# Patient Record
Sex: Female | Born: 1985 | Race: Black or African American | Hispanic: No | Marital: Married | Smoking: Former smoker
Health system: Southern US, Community
[De-identification: ages and names within clinical notes are randomized; demographics above are authoritative.]

## PROBLEM LIST (undated history)

## (undated) DIAGNOSIS — I10 Essential (primary) hypertension: Secondary | ICD-10-CM

## (undated) DIAGNOSIS — E7800 Pure hypercholesterolemia, unspecified: Secondary | ICD-10-CM

## (undated) DIAGNOSIS — E282 Polycystic ovarian syndrome: Secondary | ICD-10-CM

## (undated) DIAGNOSIS — M1990 Unspecified osteoarthritis, unspecified site: Secondary | ICD-10-CM

## (undated) HISTORY — PX: LEEP: SHX91

## (undated) HISTORY — DX: Polycystic ovarian syndrome: E28.2

## (undated) HISTORY — DX: Essential (primary) hypertension: I10

## (undated) HISTORY — DX: Unspecified osteoarthritis, unspecified site: M19.90

## (undated) HISTORY — DX: Pure hypercholesterolemia, unspecified: E78.00

---

## 2007-11-05 ENCOUNTER — Emergency Department (HOSPITAL_COMMUNITY): Admission: EM | Admit: 2007-11-05 | Discharge: 2007-11-05 | Payer: Self-pay | Admitting: Emergency Medicine

## 2007-12-03 ENCOUNTER — Emergency Department (HOSPITAL_COMMUNITY): Admission: EM | Admit: 2007-12-03 | Discharge: 2007-12-03 | Payer: Self-pay | Admitting: Emergency Medicine

## 2010-04-11 IMAGING — CR DG CHEST 2V
2 series · 2 of 2 positions shown · non-contrast
Comparison: None.

CLINICAL DATA: Cough.  Sore throat.  Fever.

CHEST - 2 VIEW

[view not recorded (1 of 2)]
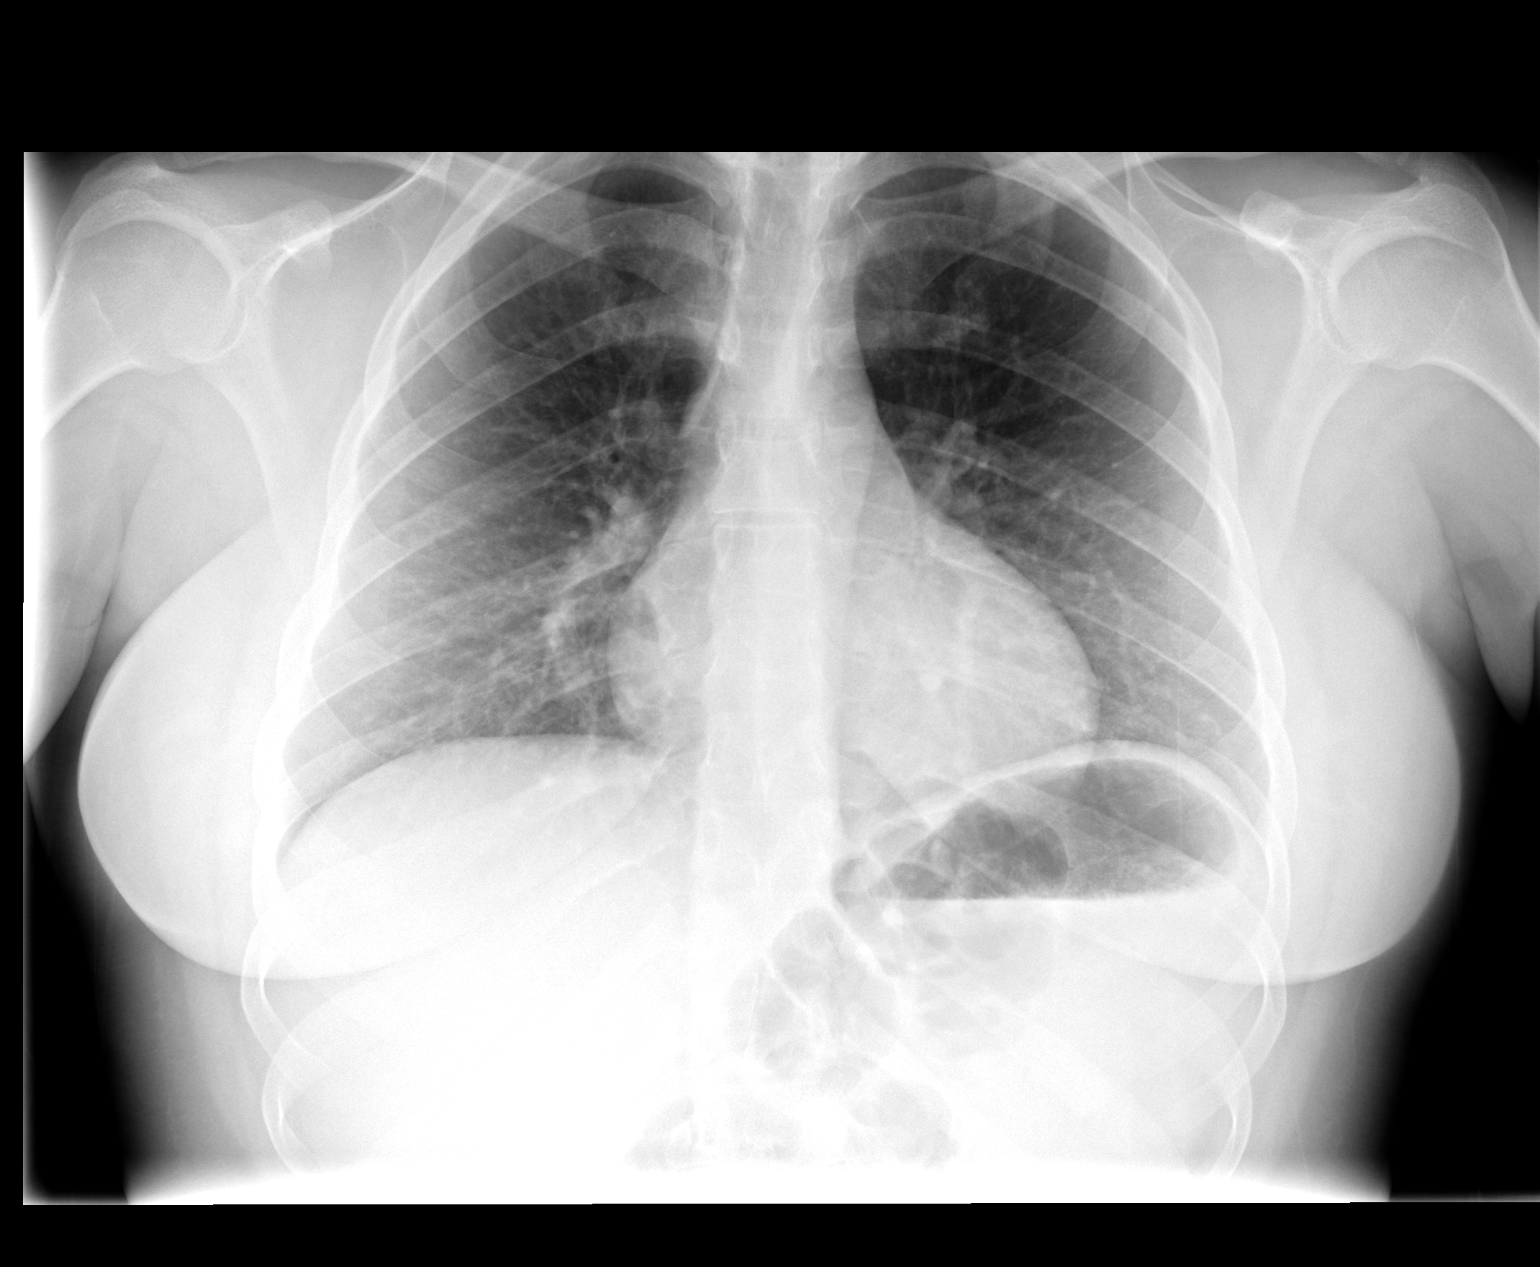

[view not recorded (2 of 2)]
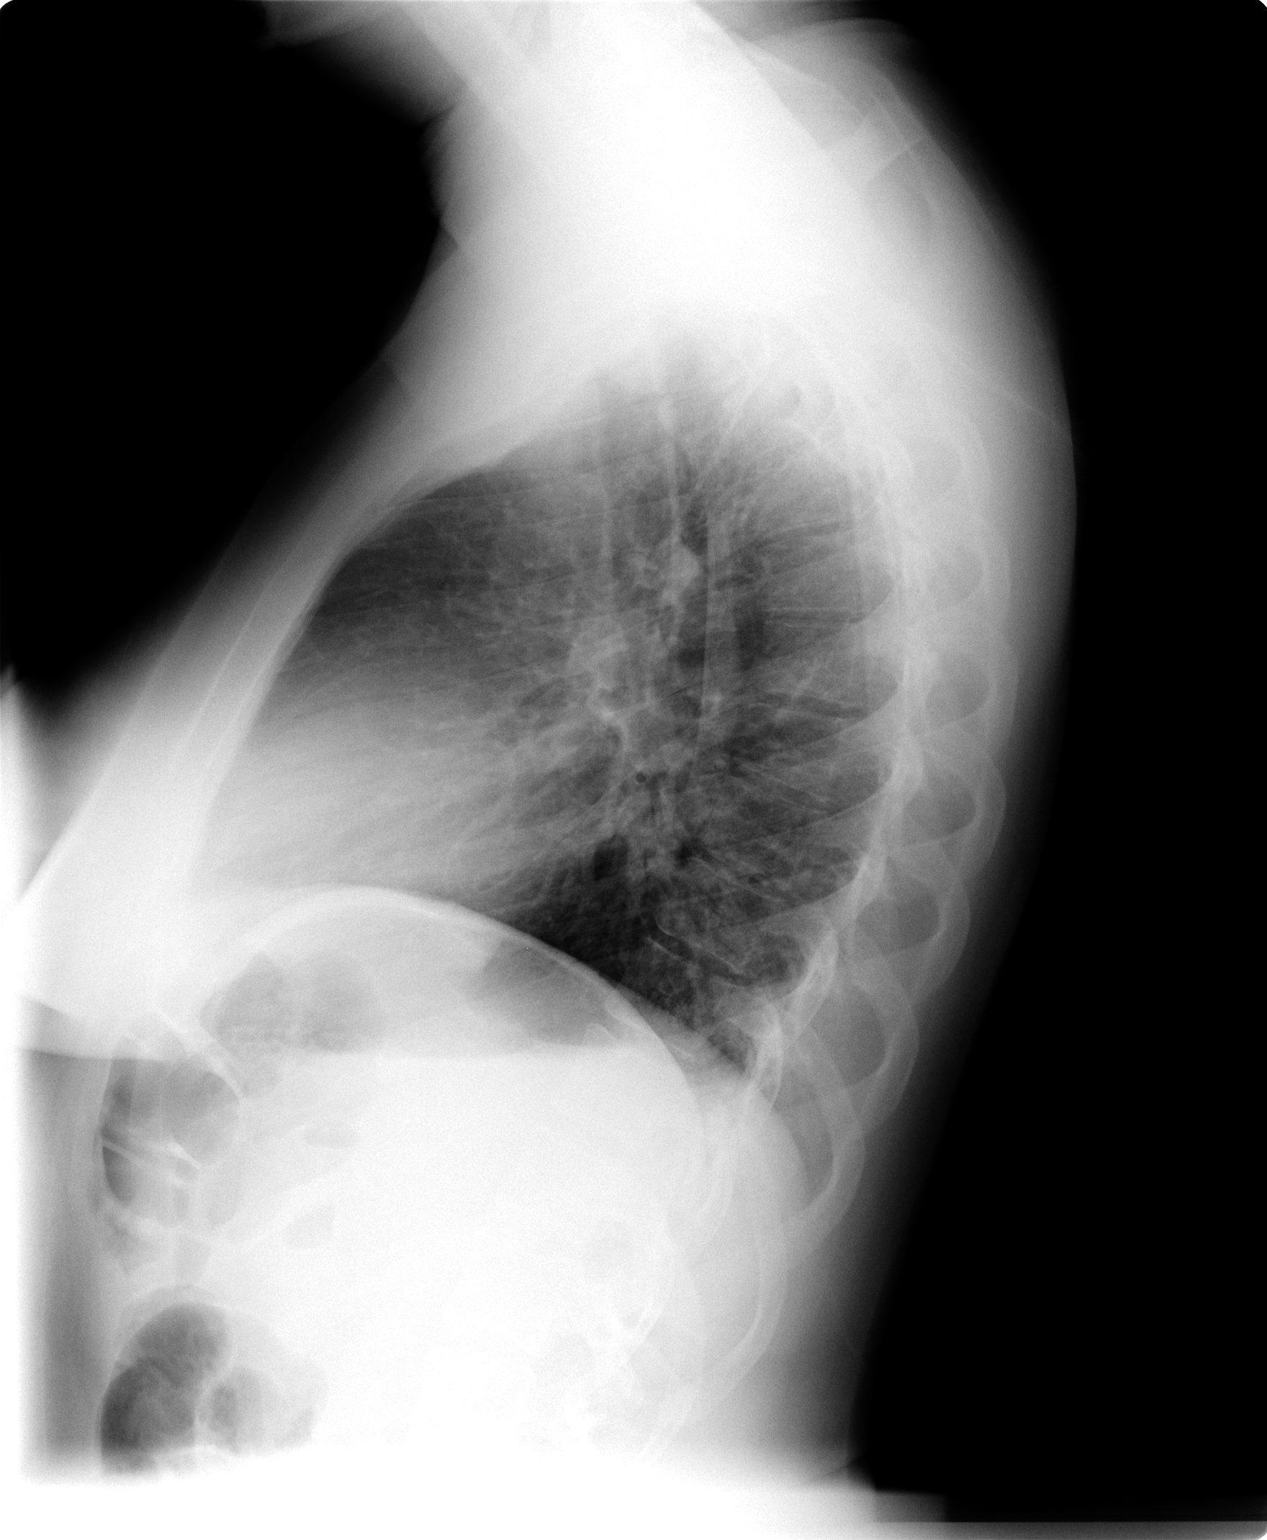

[2 of 2 positions shown; findings below may reference images not displayed]

FINDINGS: Heart size top normal with slightly globular
configuration.  No infiltrate, congestive heart failure or
pneumothorax.  Minimal peribronchial thickening.
IMPRESSION: Minimal peribronchial thickening without segmental infiltrate.

Slightly globular configuration heart.

## 2010-05-06 ENCOUNTER — Emergency Department (HOSPITAL_COMMUNITY)
Admission: EM | Admit: 2010-05-06 | Discharge: 2010-05-06 | Disposition: A | Discharge status: Home / Self Care | Payer: BC Managed Care – PPO | Attending: Emergency Medicine | Admitting: Emergency Medicine

## 2010-05-06 ENCOUNTER — Emergency Department (HOSPITAL_COMMUNITY): Payer: BC Managed Care – PPO

## 2010-05-06 DIAGNOSIS — K920 Hematemesis: Secondary | ICD-10-CM | POA: Insufficient documentation

## 2010-05-06 DIAGNOSIS — R059 Cough, unspecified: Secondary | ICD-10-CM | POA: Insufficient documentation

## 2010-05-06 DIAGNOSIS — R05 Cough: Secondary | ICD-10-CM | POA: Insufficient documentation

## 2010-05-06 LAB — URINALYSIS, ROUTINE W REFLEX MICROSCOPIC
Protein, ur: 30 mg/dL — AB
Specific Gravity, Urine: 1.036 — ABNORMAL HIGH (ref 1.005–1.030)

## 2010-05-06 LAB — GLUCOSE, CAPILLARY

## 2010-05-06 LAB — POCT I-STAT, CHEM 8
Glucose, Bld: 101 mg/dL — ABNORMAL HIGH (ref 70–99)
HCT: 41 % (ref 36.0–46.0)
TCO2: 24 mmol/L (ref 0–100)

## 2010-05-06 LAB — POCT PREGNANCY, URINE: Preg Test, Ur: NEGATIVE

## 2010-05-06 LAB — URINE MICROSCOPIC-ADD ON

## 2010-06-14 ENCOUNTER — Other Ambulatory Visit: Payer: Self-pay | Admitting: Obstetrics and Gynecology

## 2010-06-14 ENCOUNTER — Other Ambulatory Visit (HOSPITAL_COMMUNITY)
Admission: RE | Admit: 2010-06-14 | Discharge: 2010-06-14 | Disposition: A | Discharge status: Home / Self Care | Payer: Self-pay | Source: Ambulatory Visit | Attending: Obstetrics and Gynecology | Admitting: Obstetrics and Gynecology

## 2010-06-14 ENCOUNTER — Encounter (INDEPENDENT_AMBULATORY_CARE_PROVIDER_SITE_OTHER): Payer: BC Managed Care – PPO | Admitting: Obstetrics and Gynecology

## 2010-06-14 DIAGNOSIS — R87613 High grade squamous intraepithelial lesion on cytologic smear of cervix (HGSIL): Secondary | ICD-10-CM

## 2010-06-14 DIAGNOSIS — R87619 Unspecified abnormal cytological findings in specimens from cervix uteri: Secondary | ICD-10-CM | POA: Insufficient documentation

## 2010-07-07 ENCOUNTER — Ambulatory Visit (INDEPENDENT_AMBULATORY_CARE_PROVIDER_SITE_OTHER): Payer: BC Managed Care – PPO | Admitting: Obstetrics and Gynecology

## 2010-07-07 DIAGNOSIS — R87613 High grade squamous intraepithelial lesion on cytologic smear of cervix (HGSIL): Secondary | ICD-10-CM

## 2010-07-07 NOTE — Group Therapy Note (Signed)
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  The patient presents today to review these results and to discuss the next plan management.  The patient was counseled regarding the need for an excisional biopsy.  Options of in- office LEEP procedure versus cold knife cone in the operating room were discussed.  The patient is not interested in having a cold knife cone and desires an in-office procedure.  Risks, benefits, and alternatives were explained.  The patient verbalized understanding.  The patient will review the LEEP video and will be scheduled for the next available appointment for LEEP.          ______________________________ Catalina Antigua, MD    PC/MEDQ  D:  07/07/2010  T:  07/07/2010  Job:  [id number]

## 2010-08-18 ENCOUNTER — Other Ambulatory Visit: Payer: Self-pay | Admitting: Obstetrics & Gynecology

## 2010-08-18 ENCOUNTER — Encounter (INDEPENDENT_AMBULATORY_CARE_PROVIDER_SITE_OTHER): Payer: BC Managed Care – PPO | Admitting: Obstetrics & Gynecology

## 2010-08-18 ENCOUNTER — Other Ambulatory Visit (HOSPITAL_COMMUNITY)
Admission: RE | Admit: 2010-08-18 | Discharge: 2010-08-18 | Disposition: A | Discharge status: Home / Self Care | Payer: BC Managed Care – PPO | Source: Ambulatory Visit | Attending: Obstetrics & Gynecology | Admitting: Obstetrics & Gynecology

## 2010-08-18 DIAGNOSIS — D069 Carcinoma in situ of cervix, unspecified: Secondary | ICD-10-CM | POA: Insufficient documentation

## 2010-08-18 DIAGNOSIS — N871 Moderate cervical dysplasia: Secondary | ICD-10-CM

## 2010-08-19 NOTE — Group Therapy Note (Signed)
NAMEDETRICE, CALES NO.:  [id number]  MEDICAL RECORD NO.:  [id number]           PATIENT TYPE:  A  LOCATION:  [facility]                   FACILITY:  [facility]  PHYSICIAN:  Scheryl Darter, MD       DATE OF BIRTH:  [birth year]  DATE OF SERVICE:                                 CLINIC NOTE  The patient returns today for a scheduled LEEP.  She had abnormal Pap smear and biopsies that showed moderate cervical dysplasia.  She discussed the results with Dr. Jolayne Panther and she reviewed the video on LEEP.  I explained the procedure and the risks of bleeding, infection, cervical incompetence or cervical stenosis and pain or infection. Questions were answered.  She signed consent.  Time-out was performed. Speculum was inserted.  Cervix was visualized and appeared grossly normal.  Lidocaine 1% with 1:100,000 epinephrine was infiltrated for intracervical block.  Strong iodine solution was applied.  A nonstaining area at cervical os was seen.  The Fisher loop was used at 50 watts to excise circumferential specimen starting at 12 o'clock.  This was removed as a single specimen.  The cervix was cauterized and Monsel solution was applied and good hemostasis was seen.  The patient tolerated this well.  She will return in 2 weeks for results.  Gave her instructions for precautions.     Scheryl Darter, MD    JA/MEDQ  D:  08/18/2010  T:  08/19/2010  Job:  [id number]

## 2010-09-09 ENCOUNTER — Emergency Department (HOSPITAL_COMMUNITY)
Admission: EM | Admit: 2010-09-09 | Discharge: 2010-09-09 | Disposition: A | Discharge status: Home / Self Care | Payer: BC Managed Care – PPO | Attending: Emergency Medicine | Admitting: Emergency Medicine

## 2010-09-09 DIAGNOSIS — R21 Rash and other nonspecific skin eruption: Secondary | ICD-10-CM | POA: Insufficient documentation

## 2010-09-09 DIAGNOSIS — B029 Zoster without complications: Secondary | ICD-10-CM | POA: Insufficient documentation

## 2010-09-27 ENCOUNTER — Encounter: Payer: Self-pay | Admitting: *Deleted

## 2010-09-27 DIAGNOSIS — M1990 Unspecified osteoarthritis, unspecified site: Secondary | ICD-10-CM | POA: Insufficient documentation

## 2010-09-27 DIAGNOSIS — I10 Essential (primary) hypertension: Secondary | ICD-10-CM | POA: Insufficient documentation

## 2010-09-27 DIAGNOSIS — E119 Type 2 diabetes mellitus without complications: Secondary | ICD-10-CM | POA: Insufficient documentation

## 2010-09-27 DIAGNOSIS — E7800 Pure hypercholesterolemia, unspecified: Secondary | ICD-10-CM | POA: Insufficient documentation

## 2010-10-05 ENCOUNTER — Ambulatory Visit: Payer: BC Managed Care – PPO | Admitting: Obstetrics & Gynecology

## 2010-10-05 ENCOUNTER — Telehealth: Payer: Self-pay | Admitting: *Deleted

## 2010-10-05 NOTE — Telephone Encounter (Signed)
Call pt with result, should return for pap 01/2011

## 2010-10-05 NOTE — Telephone Encounter (Signed)
Will forward message to admin pool to cancel appointment for today and that pt will be scheduling 6 month pap- will call in october
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
## 2010-10-05 NOTE — Telephone Encounter (Signed)
Called patient and notified her that we got her request, results reviewed by  Dr. Debroah Loop, and he feels she does not need to come in for results appointment - that treatment plan is pap in 6 months in December- advised pt to call in October to make December appointment for 6 month pap. Pt. Voiced understanding of plan .

## 2011-04-14 ENCOUNTER — Emergency Department (HOSPITAL_COMMUNITY)
Admission: EM | Admit: 2011-04-14 | Discharge: 2011-04-14 | Disposition: A | Discharge status: Home / Self Care | Payer: Self-pay | Attending: Emergency Medicine | Admitting: Emergency Medicine

## 2011-04-14 ENCOUNTER — Encounter (HOSPITAL_COMMUNITY): Payer: Self-pay | Admitting: Emergency Medicine

## 2011-04-14 DIAGNOSIS — R6889 Other general symptoms and signs: Secondary | ICD-10-CM

## 2011-04-14 DIAGNOSIS — J111 Influenza due to unidentified influenza virus with other respiratory manifestations: Secondary | ICD-10-CM | POA: Insufficient documentation

## 2011-04-14 MED ORDER — BENZONATATE 100 MG PO CAPS: 100.0000 mg | ORAL_CAPSULE | Freq: Three times a day (TID) | ORAL | Status: AC

## 2011-04-14 MED ORDER — ACETAMINOPHEN-CODEINE #3 300-30 MG PO TABS: 1.0000 | ORAL_TABLET | Freq: Four times a day (QID) | ORAL | Status: AC | PRN

## 2011-04-14 NOTE — ED Notes (Signed)
Pt states her throat started hurting last night and now her body is aching and has been throwing up mucus.

## 2011-04-14 NOTE — ED Provider Notes (Signed)
History     CSN: [id number]  Arrival date & time 04/14/11  2051   First MD Initiated Contact with Patient 04/14/11 2209      Chief Complaint  Patient presents with  . Influenza    (Consider location/radiation/quality/duration/timing/severity/associated sxs/prior treatment) HPI  Pt presents to the ED with complaints of flu-like symptoms of cough, congestion, sore throat, muscle aches,  The patient states that the symptoms started yesterday.  Pt has been around other sick contacts and did not get the flu shot this year. The patient denies headaches, neck pain, weakness, vision changes, severe abdominal pain, inability to eat or drink, difficulty breathing, chills, fevers, ear pain, headaches, abdominal pain, vomiting, diarrhea.SOB, wheezing, chest pain. The patient has tried cough medicine, NSAIDS, and rest but has only felt mild relief.    Past Medical History  Diagnosis Date  . Arthritis   . Hypercholesterolemia   . Hypertension   . Diabetes mellitus     No past surgical history on file.  Family History  Problem Relation Age of Onset  . Diabetes Mother   . Diabetes Father   . Heart disease Father   . Heart attack Father   . Hypertension Father   . Clotting disorder Father     History  Substance Use Topics  . Smoking status: Former Games developer  . Smokeless tobacco: Not on file  . Alcohol Use: Yes    OB History    Grav Para Term Preterm Abortions TAB SAB Ect Mult Living                  Review of Systems  All other systems reviewed and are negative.    Allergies  Review of patient's allergies indicates no known allergies.  Home Medications   Current Outpatient Rx  Name Route Sig Dispense Refill  . CALTRATE 600 PO Oral Take by mouth 1 day or 1 dose.      Marland Kitchen VITAMIN B 12 PO Oral Take by mouth 1 day or 1 dose.      Marland Kitchen ERGOCALCIFEROL 50000 UNITS PO CAPS Oral Take 50,000 Units by mouth once a week.     . OMEGA-3 FATTY ACIDS 1000 MG PO CAPS Oral Take 1 g by mouth  daily.      . ACETAMINOPHEN-CODEINE #3 300-30 MG PO TABS Oral Take 1-2 tablets by mouth every 6 (six) hours as needed for pain. 15 tablet 0  . BENZONATATE 100 MG PO CAPS Oral Take 1 capsule (100 mg total) by mouth every 8 (eight) hours. 21 capsule 0    BP 108/45  Pulse 90  Temp(Src) 98 F (36.7 C) (Oral)  Resp 16  Wt 158 lb 12.8 oz (72.031 kg)  SpO2 99%  LMP 12/12/2010  Physical Exam  Nursing note and vitals reviewed. Constitutional: She appears well-developed and well-nourished. No distress.  HENT:  Head: Normocephalic and atraumatic.  Right Ear: External ear normal.  Left Ear: External ear normal.  Mouth/Throat: Uvula is midline, oropharynx is clear and moist and mucous membranes are normal.  Eyes: Conjunctivae are normal. Pupils are equal, round, and reactive to light.  Neck: Trachea normal, normal range of motion and full passive range of motion without pain. Neck supple.  Cardiovascular: Normal rate, regular rhythm, normal heart sounds and normal pulses.   Pulmonary/Chest: Effort normal and breath sounds normal. No respiratory distress. Chest wall is not dull to percussion. She exhibits no tenderness, no crepitus, no edema, no deformity and no retraction.  Abdominal: Soft. Normal  appearance.  Musculoskeletal: Normal range of motion.  Neurological: She is alert. She has normal strength.  Skin: Skin is warm, dry and intact. She is not diaphoretic.  Psychiatric: She has a normal mood and affect. Her speech is normal. Cognition and memory are normal.    ED Course  Procedures (including critical care time)  Labs Reviewed - No data to display No results found.   1. Flu-like symptoms       MDM  pts most bothersome symptoms are cough and sore throat. I will give pt Rx for tessalon perls and Tylenol 3. Pt also given work note for tomorrow. Pt given strict return to ED precautions        Dorthula Matas, PA 04/14/11 2228

## 2011-04-14 NOTE — Discharge Instructions (Signed)
Influenza Facts Flu (influenza) is a contagious respiratory illness caused by the influenza viruses. It can cause mild to severe illness. While most healthy people recover from the flu without specific treatment and without complications, older people, young children, and people with certain health conditions are at higher risk for serious complications from the flu, including death. CAUSES   The flu virus is spread from person to person by respiratory droplets from coughing and sneezing.   A person can also become infected by touching an object or surface with a virus on it and then touching their mouth, eye or nose.   Adults may be able to infect others from 1 day before symptoms occur and up to 7 days after getting sick. So it is possible to give someone the flu even before you know you are sick and continue to infect others while you are sick.  SYMPTOMS   Fever (usually high).   Headache.   Tiredness (can be extreme).   Cough.   Sore throat.   Runny or stuffy nose.   Body aches.   Diarrhea and vomiting may also occur, particularly in children.   These symptoms are referred to as "flu-like symptoms". A lot of different illnesses, including the common cold, can have similar symptoms.  DIAGNOSIS   There are tests that can determine if you have the flu as long you are tested within the first 2 or 3 days of illness.   A doctor's exam and additional tests may be needed to identify if you have a disease that is a complicating the flu.  RISKS AND COMPLICATIONS  Some of the complications caused by the flu include:  Bacterial pneumonia or progressive pneumonia caused by the flu virus.   Loss of body fluids (dehydration).   Worsening of chronic medical conditions, such as heart failure, asthma, or diabetes.   Sinus problems and ear infections.  HOME CARE INSTRUCTIONS   Seek medical care early on.   If you are at high risk from complications of the flu, consult your health-care  provider as soon as you develop flu-like symptoms. Those at high risk for complications include:   People 65 years or older.   People with chronic medical conditions, including diabetes.   Pregnant women.   Young children.   Your caregiver may recommend use of an antiviral medication to help treat the flu.   If you get the flu, get plenty of rest, drink a lot of liquids, and avoid using alcohol and tobacco.   You can take over-the-counter medications to relieve the symptoms of the flu if your caregiver approves. (Never give aspirin to children or teenagers who have flu-like symptoms, particularly fever).  PREVENTION  The single best way to prevent the flu is to get a flu vaccine each fall. Other measures that can help protect against the flu are:  Antiviral Medications   A number of antiviral drugs are approved for use in preventing the flu. These are prescription medications, and a doctor should be consulted before they are used.   Habits for Good Health   Cover your nose and mouth with a tissue when you cough or sneeze, throw the tissue away after you use it.   Wash your hands often with soap and water, especially after you cough or sneeze. If you are not near water, use an alcohol-based hand cleaner.   Avoid people who are sick.   If you get the flu, stay home from work or school. Avoid contact with   other people so that you do not make them sick, too.   Try not to touch your eyes, nose, or mouth as germs ore often spread this way.  IN CHILDREN, EMERGENCY WARNING SIGNS THAT NEED URGENT MEDICAL ATTENTION:  Fast breathing or trouble breathing.   Bluish skin color.   Not drinking enough fluids.   Not waking up or not interacting.   Being so irritable that the child does not want to be held.   Flu-like symptoms improve but then return with fever and worse cough.   Fever with a rash.  IN ADULTS, EMERGENCY WARNING SIGNS THAT NEED URGENT MEDICAL ATTENTION:  Difficulty  breathing or shortness of breath.   Pain or pressure in the chest or abdomen.   Sudden dizziness.   Confusion.   Severe or persistent vomiting.  SEEK IMMEDIATE MEDICAL CARE IF:  You or someone you know is experiencing any of the symptoms above. When you arrive at the emergency center,report that you think you have the flu. You may be asked to wear a mask and/or sit in a secluded area to protect others from getting sick. MAKE SURE YOU:   Understand these instructions.   Monitor your condition.   Seek medical care if you are getting worse, or not improving.  Document Released: 02/08/2003 Document Revised: 10/18/2010 Document Reviewed: 11/04/2008 [facility] Patient Information 2012 [place], [place].Influenza, Adult Influenza ("the flu") is a viral infection of the respiratory tract. It causes chills, fever, cough, headache, body aches, and sore throat. Influenza in general will make you feel sicker than when you have a common cold. Symptoms of the illness typically last a few days. Cough and fatigue may continue for as long as 7 to 10 days. Influenza is highly contagious. It spreads easily to others in the droplets from coughs and sneezes. People frequently become infected by touching something that was recently contaminated with the virus and then touch their mouth, nose or eyes. This infection is caused by a virus. Symptoms will not be reduced or improved by taking an antibiotic. Antibiotics are medications that kill bacteria, not viruses. DIAGNOSIS  Diagnosis of influenza is often made based on the history and physical examination as well as the presence of influenza reports occurring in your community. Testing can be done if the diagnosis is not certain. TREATMENT  Since influenza is caused by a virus, antibiotics are not helpful. Your caregiver may prescribe antiviral medicines to shorten the illness and lessen the severity. Your caregiver may also recommend influenza vaccination and/or  antiviral medicines for your family members in order to prevent the spread of influenza to them. HOME CARE INSTRUCTIONS  DO NOT GIVE ASPIRIN TO PERSONS WITH INFLUENZA WHO ARE UNDER 48 YEARS OF AGE. This could lead to brain and liver damage (Reye's syndrome). Read the label on over-the-counter medicines.   Stay home from work or school if at all possible until most of your symptoms are gone.   Only take over-the-counter or prescription medicines for pain, discomfort, or fever as directed by your caregiver.   Use a cool mist humidifier to increase air moisture. This will make breathing easier.   Rest until your temperature is nearly normal: 98.6 F (37 C). This usually takes 3 to 4 days. Be sure you get plenty of rest.   Drink at least eight, eight-ounce glasses of fluids per day. Fluids include water, juice, broth, gelatin, or lemonade.   Cover your mouth and nose when coughing or sneezing and wash your hands often to  prevent the spread of this virus to other persons.  PREVENTION  Annual influenza vaccination (flu shots) is the best way to avoid getting influenza. An annual flu shot is now routinely recommended for all adults in the [place] SEEK MEDICAL CARE IF:   You develop shortness of breath while resting.   You have a deep cough with production of mucous or chest pain.   You develop nausea (feeling sick to your stomach), vomiting, or diarrhea.  SEEK IMMEDIATE MEDICAL CARE IF:   You have difficulty breathing, become short of breath, or your skin or nails turn bluish.   You develop severe neck pain or stiffness.   You develop a severe headache, facial pain, or earache.   You have a fever.   You develop nausea or vomiting that cannot be controlled.  Document Released: 02/03/2000 Document Revised: 10/18/2010 Document Reviewed: 12/08/2008 [facility] Patient Information 2012 [place], [place].

## 2011-04-15 NOTE — ED Provider Notes (Signed)
Medical screening examination/treatment/procedure(s) were performed by non-physician practitioner and as supervising physician I was immediately available for consultation/collaboration.  Juliet Rude. Rubin Payor, MD 04/15/11 1478

## 2012-09-12 IMAGING — CR DG CHEST 2V
2 series · 2 of 2 positions shown · non-contrast
Comparison: 12/03/2007

CLINICAL DATA: Vomiting.  Cough.

CHEST - 2 VIEW

[w chest pa]
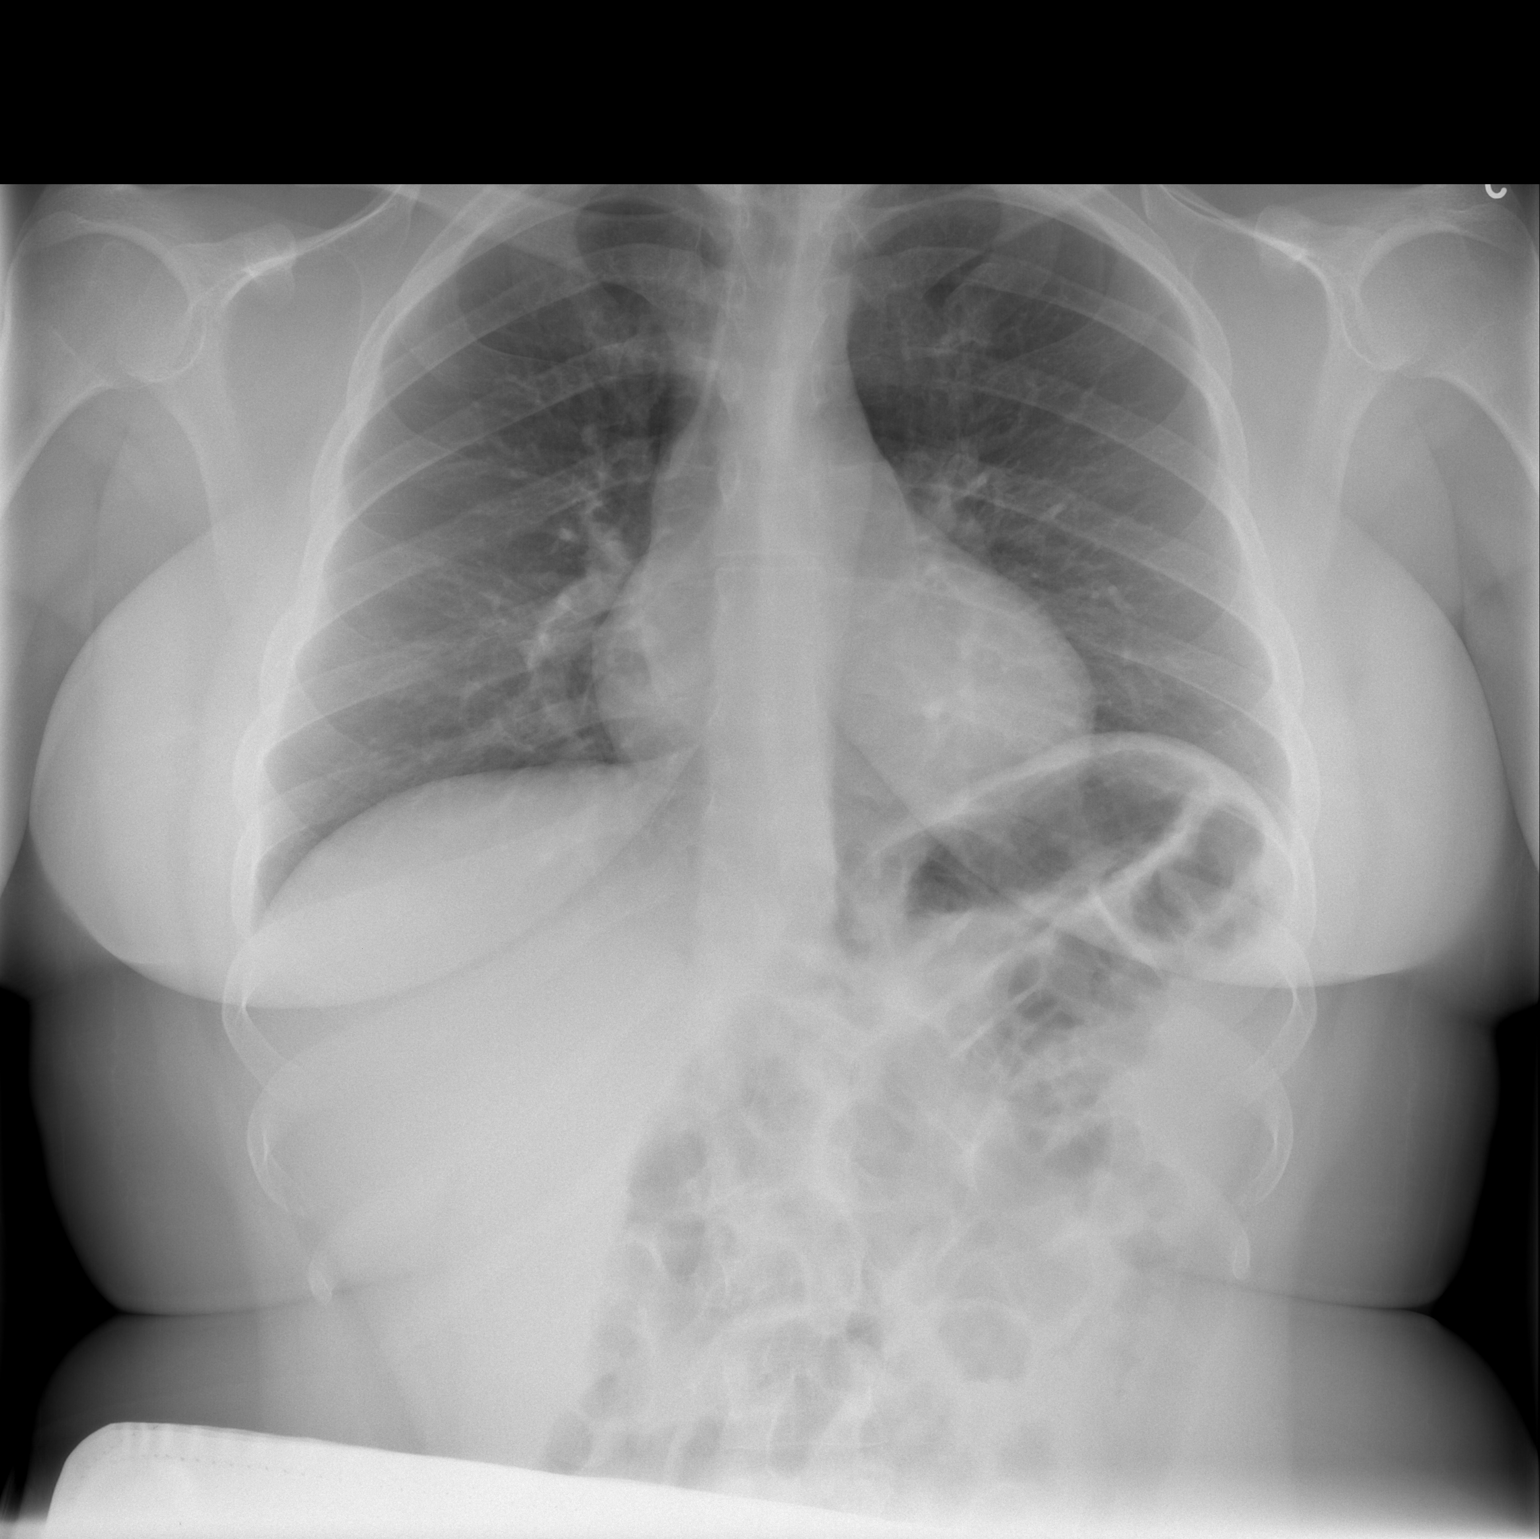

[w chest lat]
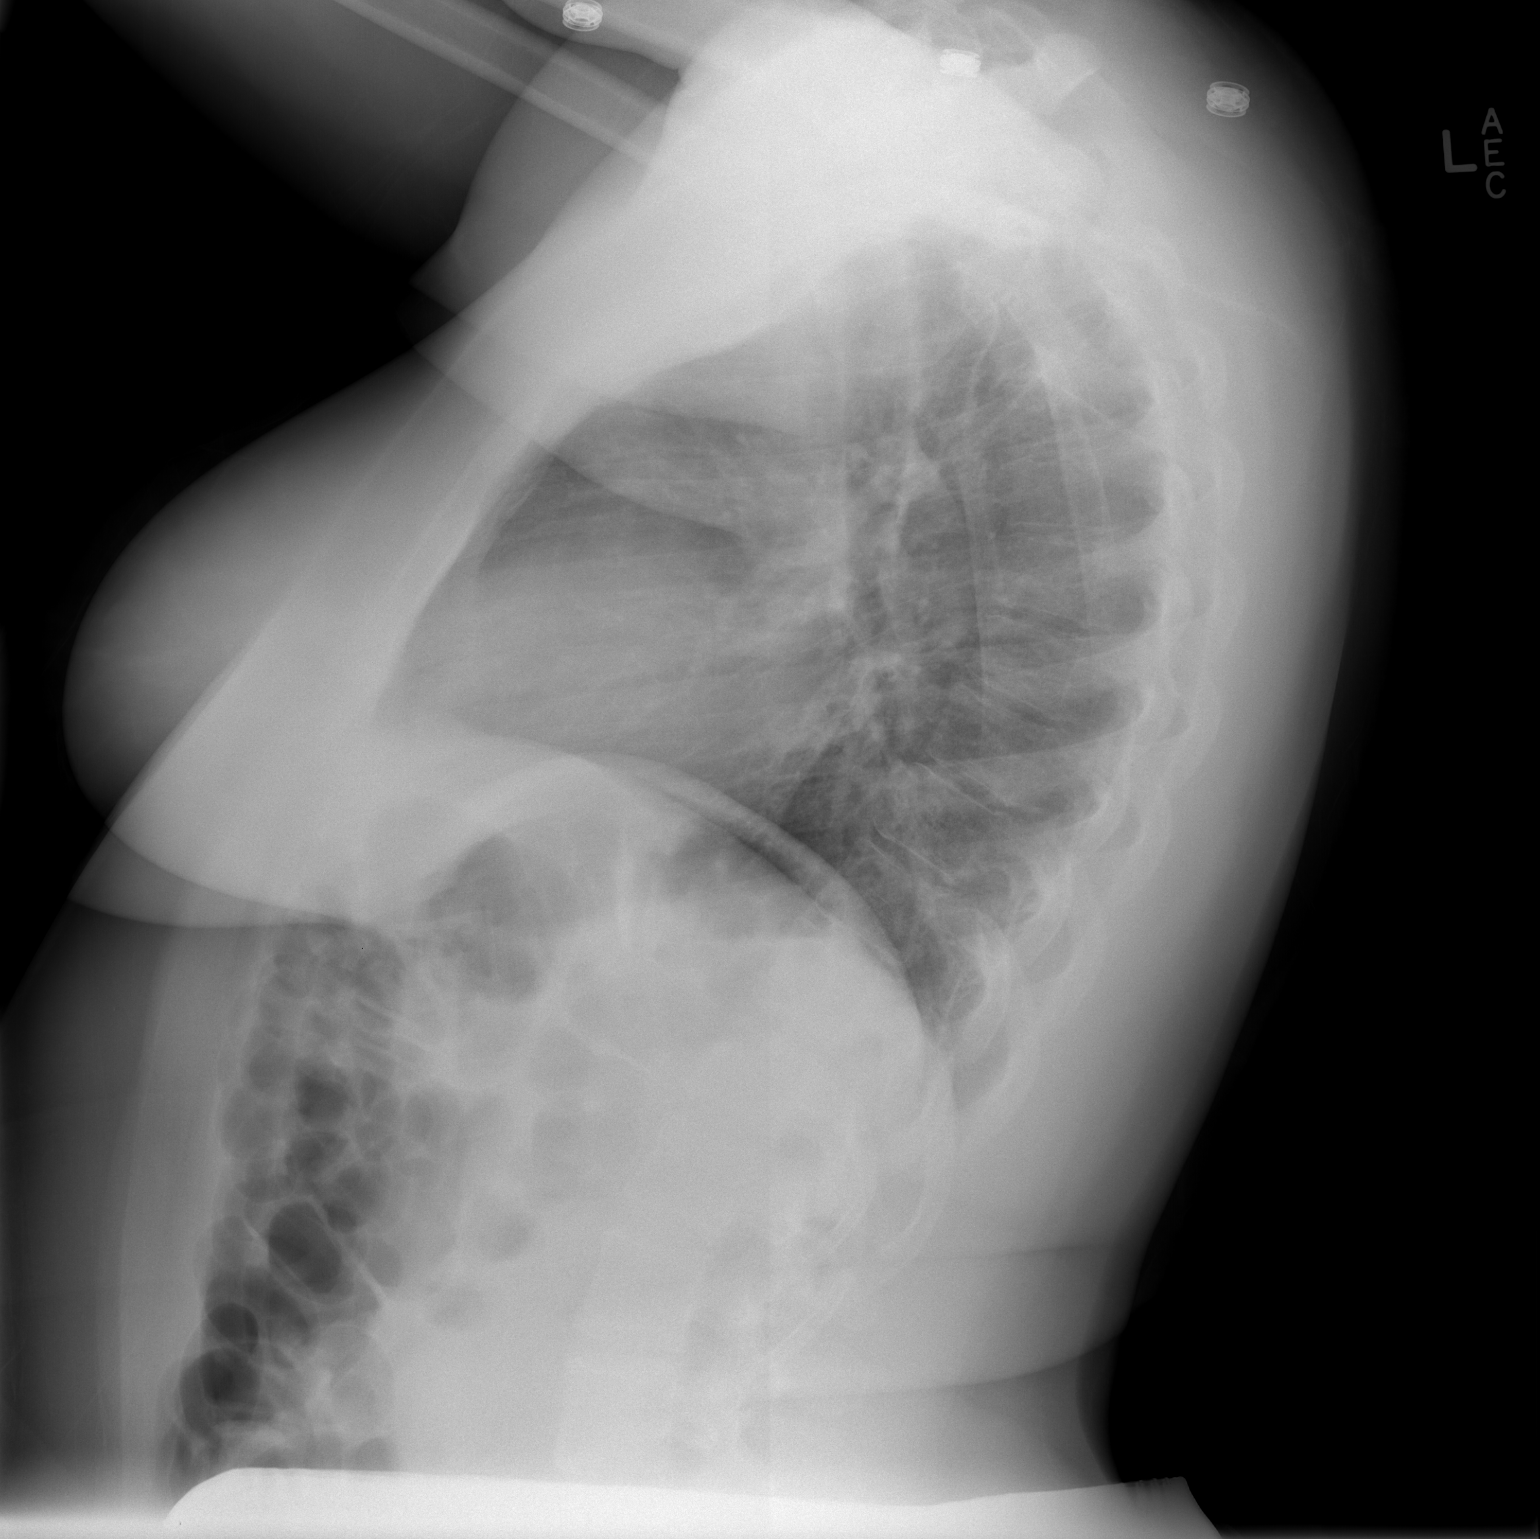

[2 of 2 positions shown; findings below may reference images not displayed]

FINDINGS: Cardiomediastinal silhouette is within normal limits.
Lungs are clear.  Mild gaseous distention in both small and large
bowel within the abdomen.
IMPRESSION: No active cardiopulmonary disease.

## 2021-11-07 ENCOUNTER — Ambulatory Visit
Admission: EM | Admit: 2021-11-07 | Discharge: 2021-11-07 | Disposition: A | Discharge status: Home / Self Care | Payer: 59 | Attending: Emergency Medicine | Admitting: Emergency Medicine

## 2021-11-07 ENCOUNTER — Encounter: Payer: Self-pay | Admitting: Emergency Medicine

## 2021-11-07 DIAGNOSIS — N760 Acute vaginitis: Secondary | ICD-10-CM | POA: Diagnosis present

## 2021-11-07 LAB — CHG URINE PREGNANCY TEST VISUAL COLOR CMPRSN METHS: Pregnancy Test, Urine: POSITIVE

## 2021-11-07 NOTE — Discharge Instructions (Signed)
The results of your vaginal swab test which screens for BV, yeast, gonorrhea, chlamydia and trichomonas will be made available to you once it is complete.  This typically takes 3 to 5 days.  Please note that we do not test for herpes virus unless you are having an active lesion concerning for herpes outbreak.  Please abstain from sexual intercourse of any kind, vaginal, oral or anal, until you have received the results of your STD testing.   The results will initially be posted to your MyChart account and, if any of your results are abnormal, you will receive a phone call regarding treatment.  Prescriptions, if any are needed, will be provided for you at your pharmacy.     If you have not had complete resolution of your symptoms after completing treatment, if any, please return for repeat evaluation.   Thank you for visiting urgent care today.  I appreciate the opportunity to participate in your care.

## 2021-11-07 NOTE — ED Triage Notes (Signed)
Pt found out was pregnant [redacted] weeks ago. Went to [facility] and was positive for BV. Was advised to follow up OB since pregnant before due treatment.  Pt just left [facility], NP told to come to urgent care for treatment.  Pt having bad vaginal odor and some irritation.

## 2021-11-07 NOTE — ED Provider Notes (Signed)
[facility]    CSN: [id number] Arrival date & time: 11/07/21  1655    HISTORY   Chief Complaint  Patient presents with   Vaginitis   HPI Ana Taylor is a pleasant, 36 y.o. female who presents to urgent care today. Patient states she found out she was pregnant [redacted] weeks ago.  Patient states she does not currently have an obstetrician due to her insurance changing.  Patient states [facility] advised her to go to urgent care for treatment.  This is the same nurse practitioner that diagnosed her with bacterial vaginitis 12 days ago but was unwilling to treat her for reasons not made fully clear in her documentation in patient's chart.  Patient states she continues to have foul-smelling vaginal discharge along with mild vaginal irritation.  Patient endorses vaginal discharge with fishy odor and vaginal irritation.  Patient denies burning with urination, increased frequency of urination, perineal pain, fever, chills, malaise, rigors, significant fatigue, genital lesion(s), and possible exposure to STD.     The history is provided by the patient.   Past Medical History:  Diagnosis Date   Arthritis    Diabetes mellitus    Hypercholesterolemia    Hypertension    Patient Active Problem List   Diagnosis Date Noted   Arthritis 09/27/2010   Hypertension 09/27/2010   Diabetes mellitus 09/27/2010   Hypercholesterolemia    History reviewed. No pertinent surgical history. OB History     Gravida  1   Para      Term      Preterm      AB      Living         SAB      IAB      Ectopic      Multiple      Live Births             Home Medications    Prior to Admission medications   Not on File    Family History Family History  Problem Relation Age of Onset   Diabetes Mother    Diabetes Father    Heart disease Father    Heart attack Father    Hypertension Father    Clotting disorder  Father    Social History Social History   Tobacco Use   Smoking status: Former  Substance Use Topics   Alcohol use: Yes   Allergies   Patient has no known allergies.  Review of Systems Review of Systems Pertinent findings revealed after performing a 14 point review of systems has been noted in the history of present illness.  Physical Exam Triage Vital Signs ED Triage Vitals  Enc Vitals Group     BP 12/16/20 0827 (!) 147/82     Pulse Rate 12/16/20 0827 72     Resp 12/16/20 0827 18     Temp 12/16/20 0827 98.3 F (36.8 C)     Temp Source 12/16/20 0827 Oral     SpO2 12/16/20 0827 98 %     Weight --      Height --      Head Circumference --      Peak Flow --      Pain Score 12/16/20 0826 5     Pain Loc --      Pain Edu? --      Excl. in GC? --   No data found.  Updated Vital Signs  BP 122/79 (BP Location: Left Arm)   Pulse 66   Temp 98.4 F (36.9 C) (Oral)   Resp 17   SpO2 98%   Physical Exam Vitals and nursing note reviewed.  Constitutional:      General: She is not in acute distress.    Appearance: Normal appearance. She is not ill-appearing.  HENT:     Head: Normocephalic and atraumatic.  Eyes:     General: Lids are normal.        Right eye: No discharge.        Left eye: No discharge.     Extraocular Movements: Extraocular movements intact.     Conjunctiva/sclera: Conjunctivae normal.     Right eye: Right conjunctiva is not injected.     Left eye: Left conjunctiva is not injected.  Neck:     Trachea: Trachea and phonation normal.  Cardiovascular:     Rate and Rhythm: Normal rate and regular rhythm.     Pulses: Normal pulses.     Heart sounds: Normal heart sounds. No murmur heard.    No friction rub. No gallop.  Pulmonary:     Effort: Pulmonary effort is normal. No accessory muscle usage, prolonged expiration or respiratory distress.     Breath sounds: Normal breath sounds. No stridor, decreased air movement or transmitted upper airway sounds. No  decreased breath sounds, wheezing, rhonchi or rales.  Chest:     Chest wall: No tenderness.  Genitourinary:    Comments: Patient politely declines pelvic exam today, patient provided a vaginal swab for testing. Musculoskeletal:        General: Normal range of motion.     Cervical back: Normal range of motion and neck supple. Normal range of motion.  Lymphadenopathy:     Cervical: No cervical adenopathy.  Skin:    General: Skin is warm and dry.     Findings: No erythema or rash.  Neurological:     General: No focal deficit present.     Mental Status: She is alert and oriented to person, place, and time.  Psychiatric:        Mood and Affect: Mood normal.        Behavior: Behavior normal.     Visual Acuity Right Eye Distance:   Left Eye Distance:   Bilateral Distance:    Right Eye Near:   Left Eye Near:    Bilateral Near:     UC Couse / Diagnostics / Procedures:     Radiology No results found.  Procedures Procedures (including critical care time) EKG  Pending results:  Labs Reviewed  CERVICOVAGINAL ANCILLARY ONLY    Medications Ordered in UC: Medications - No data to display  UC Diagnoses / Final Clinical Impressions(s)   I have reviewed the triage vital signs and the nursing notes.  Pertinent labs & imaging results that were available during my care of the patient were reviewed by me and considered in my medical decision making (see chart for details).    Final diagnoses:  Acute vaginitis    STD screening was performed, patient advised that the results be posted to their MyChart and if any of the results are positive, they will be notified by phone, further treatment will be provided as indicated based on results of STD screening. Patient was advised to abstain from sexual intercourse until that they receive the results of their STD testing.  Patient was also advised to use condoms to protect themselves from STD exposure. Return precautions advised.  Drug  allergies reviewed, all questions addressed.     ED Prescriptions   None    PDMP not reviewed this encounter.  Disposition Upon Discharge:  Condition: stable for discharge home  Patient presented with concern for an acute illness with associated systemic symptoms and significant discomfort requiring urgent management. In my opinion, this is a condition that a prudent lay person (someone who possesses an average knowledge of health and medicine) may potentially expect to result in complications if not addressed urgently such as respiratory distress, impairment of bodily function or dysfunction of bodily organs.   As such, the patient has been evaluated and assessed, work-up was performed and treatment was provided in alignment with urgent care protocols and evidence based medicine.  Patient/parent/caregiver has been advised that the patient may require follow up for further testing and/or treatment if the symptoms continue in spite of treatment, as clinically indicated and appropriate.  Routine symptom specific, illness specific and/or disease specific instructions were discussed with the patient and/or caregiver at length.  Prevention strategies for avoiding STD exposure were also discussed.  The patient will follow up with their current PCP if and as advised. If the patient does not currently have a PCP we will assist them in obtaining one.   The patient may need specialty follow up if the symptoms continue, in spite of conservative treatment and management, for further workup, evaluation, consultation and treatment as clinically indicated and appropriate.  Patient/parent/caregiver verbalized understanding and agreement of plan as discussed.  All questions were addressed during visit.  Please see discharge instructions below for further details of plan.  Discharge Instructions:   Discharge Instructions      The results of your vaginal swab test which screens for BV, yeast, gonorrhea,  chlamydia and trichomonas will be made available to you once it is complete.  This typically takes 3 to 5 days.  Please note that we do not test for herpes virus unless you are having an active lesion concerning for herpes outbreak.  Please abstain from sexual intercourse of any kind, vaginal, oral or anal, until you have received the results of your STD testing.   The results will initially be posted to your MyChart account and, if any of your results are abnormal, you will receive a phone call regarding treatment.  Prescriptions, if any are needed, will be provided for you at your pharmacy.     If you have not had complete resolution of your symptoms after completing treatment, if any, please return for repeat evaluation.   Thank you for visiting urgent care today.  I appreciate the opportunity to participate in your care.       This office note has been dictated using Museum/gallery curator.  Unfortunately, this method of dictation can sometimes lead to typographical or grammatical errors.  I apologize for your inconvenience in advance if this occurs.  Please do not hesitate to reach out to me if clarification is needed.       Lynden Oxford Scales, [place] 11/07/21 [phone]

## 2021-11-10 ENCOUNTER — Telehealth (HOSPITAL_COMMUNITY): Payer: Self-pay

## 2021-11-10 LAB — CERVICOVAGINAL ANCILLARY ONLY
Bacterial Vaginitis (gardnerella): POSITIVE — AB
Candida Glabrata: NEGATIVE
Candida Vaginitis: NEGATIVE
Chlamydia: NEGATIVE
Comment: NEGATIVE
Comment: NEGATIVE
Comment: NEGATIVE
Comment: NEGATIVE
Comment: NEGATIVE
Comment: NORMAL
Neisseria Gonorrhea: NEGATIVE
Trichomonas: NEGATIVE

## 2021-11-10 MED ORDER — METRONIDAZOLE 500 MG PO TABS: 500.0000 mg | ORAL_TABLET | Freq: Two times a day (BID) | ORAL | 0 refills | Status: DC

## 2021-11-10 MED ORDER — METRONIDAZOLE 0.75 % VA GEL: 1.0000 | Freq: Two times a day (BID) | VAGINAL | 0 refills | Status: DC

## 2021-11-23 ENCOUNTER — Emergency Department (HOSPITAL_COMMUNITY)
Admission: EM | Admit: 2021-11-23 | Discharge: 2021-11-23 | Disposition: A | Discharge status: Home / Self Care | Payer: 59 | Attending: Emergency Medicine | Admitting: Emergency Medicine

## 2021-11-23 ENCOUNTER — Emergency Department (HOSPITAL_COMMUNITY): Payer: 59

## 2021-11-23 ENCOUNTER — Other Ambulatory Visit: Payer: Self-pay

## 2021-11-23 DIAGNOSIS — O468X1 Other antepartum hemorrhage, first trimester: Secondary | ICD-10-CM | POA: Diagnosis present

## 2021-11-23 DIAGNOSIS — Z3A01 Less than 8 weeks gestation of pregnancy: Secondary | ICD-10-CM | POA: Insufficient documentation

## 2021-11-23 DIAGNOSIS — O200 Threatened abortion: Secondary | ICD-10-CM

## 2021-11-23 DIAGNOSIS — O418X10 Other specified disorders of amniotic fluid and membranes, first trimester, not applicable or unspecified: Secondary | ICD-10-CM

## 2021-11-23 LAB — CBC WITH DIFFERENTIAL/PLATELET
Abs Immature Granulocytes: 0.05 10*3/uL (ref 0.00–0.07)
Basophils Absolute: 0.1 10*3/uL (ref 0.0–0.1)
Basophils Relative: 1 %
Eosinophils Absolute: 0.1 10*3/uL (ref 0.0–0.5)
Eosinophils Relative: 1 %
HCT: 43.5 % (ref 36.0–46.0)
Hemoglobin: 14.1 g/dL (ref 12.0–15.0)
Immature Granulocytes: 1 %
Lymphocytes Relative: 26 %
Lymphs Abs: 2.7 10*3/uL (ref 0.7–4.0)
MCH: 29.6 pg (ref 26.0–34.0)
MCHC: 32.4 g/dL (ref 30.0–36.0)
MCV: 91.2 fL (ref 80.0–100.0)
Monocytes Absolute: 0.7 10*3/uL (ref 0.1–1.0)
Monocytes Relative: 7 %
Neutro Abs: 6.9 10*3/uL (ref 1.7–7.7)
Neutrophils Relative %: 64 %
Platelets: 303 10*3/uL (ref 150–400)
RBC: 4.77 MIL/uL (ref 3.87–5.11)
RDW: 14.6 % (ref 11.5–15.5)
WBC: 10.4 10*3/uL (ref 4.0–10.5)
nRBC: 0 % (ref 0.0–0.2)

## 2021-11-23 LAB — URINALYSIS, ROUTINE W REFLEX MICROSCOPIC
Bacteria, UA: NONE SEEN
Bilirubin Urine: NEGATIVE
Glucose, UA: NEGATIVE mg/dL
Ketones, ur: NEGATIVE mg/dL
Leukocytes,Ua: NEGATIVE
Nitrite: NEGATIVE
Protein, ur: NEGATIVE mg/dL
RBC / HPF: >50 RBC/hpf — ABNORMAL HIGH (ref 0–5)
Specific Gravity, Urine: 1.025 (ref 1.005–1.030)
pH: 6 (ref 5.0–8.0)

## 2021-11-23 LAB — COMPREHENSIVE METABOLIC PANEL
ALT: 22 U/L (ref 0–44)
AST: 16 U/L (ref 15–41)
Albumin: 3.4 g/dL — ABNORMAL LOW (ref 3.5–5.0)
Alkaline Phosphatase: 35 U/L — ABNORMAL LOW (ref 38–126)
Anion gap: 8 (ref 5–15)
BUN: 13 mg/dL (ref 6–20)
CO2: 21 mmol/L — ABNORMAL LOW (ref 22–32)
Calcium: 9 mg/dL (ref 8.9–10.3)
Chloride: 107 mmol/L (ref 98–111)
Creatinine, Ser: 0.57 mg/dL (ref 0.44–1.00)
GFR, Estimated: >60 mL/min (ref >60)
Glucose, Bld: 98 mg/dL (ref 70–99)
Potassium: 3.9 mmol/L (ref 3.5–5.1)
Sodium: 136 mmol/L (ref 135–145)
Total Bilirubin: 0.9 mg/dL (ref 0.3–1.2)
Total Protein: 6.3 g/dL — ABNORMAL LOW (ref 6.5–8.1)

## 2021-11-23 LAB — RH IG WORKUP (INCLUDES ABO/RH)
ABO/RH(D): B POS
Gestational Age(Wks): 7

## 2021-11-23 LAB — HCG, QUANTITATIVE, PREGNANCY: hCG, Beta Chain, Quant, S: 243772 m[IU]/mL — ABNORMAL HIGH (ref <5)

## 2021-11-23 NOTE — ED Triage Notes (Signed)
Patient coming to ED for evaluation of vaginal bleeding.  Reports she is currently [redacted] weeks pregnant.  Woke up this morning to use the bathroom and noticed bleeding.  No cramps at this time.

## 2021-11-23 NOTE — ED Notes (Signed)
Pt ambulatory to waiting room. Pt verbalized understanding of discharge instructions.   

## 2021-11-23 NOTE — ED Provider Notes (Signed)
[facility]EMERGENCY [facility]PT Provider Note   CSN: [id number] Arrival date & time: 11/23/21  0459     History  Chief Complaint  Patient presents with   Vaginal Bleeding    Ana Taylor is a 36 y.o. female.   Vaginal Bleeding    Patient is G1, P0 at approximately [redacted] weeks EGA.  Patient states she has seen an OB/GYN.  She has had ultrasounds confirming IUP.  This morning she started having vaginal bleeding.  Patient states she noticed a large blood clot when wiping.  She has not noticed any tissue.  She is not having any pain.  No fevers or chills.  Home Medications Prior to Admission medications   Medication Sig Start Date End Date Taking? Authorizing Provider  metroNIDAZOLE (METROGEL VAGINAL) 0.75 % vaginal gel Place 1 Applicatorful vaginally 2 (two) times daily. 11/10/21   Lamptey, Britta Mccreedy, MD      Allergies    Patient has no known allergies.    Review of Systems   Review of Systems  Genitourinary:  Positive for vaginal bleeding.    Physical Exam Updated Vital Signs BP 116/77   Pulse 84   Temp 98 F (36.7 C) (Oral)   Resp 18   Ht 1.626 m (5\' 4" )   Wt 76.2 kg   SpO2 97%   BMI 28.84 kg/m  Physical Exam Vitals and nursing note reviewed. Exam conducted with a chaperone present.  Constitutional:      General: She is not in acute distress.    Appearance: She is well-developed.  HENT:     Head: Normocephalic and atraumatic.     Right Ear: External ear normal.     Left Ear: External ear normal.  Eyes:     General: No scleral icterus.       Right eye: No discharge.        Left eye: No discharge.     Conjunctiva/sclera: Conjunctivae normal.  Neck:     Trachea: No tracheal deviation.  Cardiovascular:     Rate and Rhythm: Normal rate.  Pulmonary:     Effort: Pulmonary effort is normal. No respiratory distress.     Breath sounds: No stridor.  Abdominal:     General: There is no distension.  Genitourinary:    General: Normal  vulva.     Vagina: Bleeding present. No lesions.     Cervix: Cervical bleeding present.     Uterus: Enlarged. Not tender.      Adnexa:        Right: No mass.         Left: No mass.       Comments: Cervical os closed Musculoskeletal:        General: No swelling or deformity.     Cervical back: Neck supple.  Skin:    General: Skin is warm and dry.     Findings: No rash.  Neurological:     Mental Status: She is alert.     Cranial Nerves: Cranial nerve deficit: no gross deficits.     ED Results / Procedures / Treatments   Labs (all labs ordered are listed, but only abnormal results are displayed) Labs Reviewed  COMPREHENSIVE METABOLIC PANEL - Abnormal; Notable for the following components:      Result Value   CO2 21 (*)    Total Protein 6.3 (*)    Albumin 3.4 (*)    Alkaline Phosphatase 35 (*)    All other components within normal limits  HCG, QUANTITATIVE, PREGNANCY - Abnormal; Notable for the following components:   hCG, Beta Chain, Laqueta Carina 243,772 (*)    All other components within normal limits  URINALYSIS, ROUTINE W REFLEX MICROSCOPIC - Abnormal; Notable for the following components:   Hgb urine dipstick LARGE (*)    RBC / HPF >50 (*)    All other components within normal limits  CBC WITH DIFFERENTIAL/PLATELET  RH IG WORKUP (INCLUDES ABO/RH)  GC/CHLAMYDIA PROBE AMP ([place]) NOT AT [facility]    EKG None  Radiology US OB Comp Less 14 Wks  Result Date: 11/23/2021 CLINICAL DATA:  Vaginal bleeding EXAM: OBSTETRIC <14 WK [place] AND TRANSVAGINAL OB US TECHNIQUE: Both transabdominal and transvaginal ultrasound examinations were performed for complete evaluation of the gestation as well as the maternal uterus, adnexal regions, and pelvic cul-de-sac. Transvaginal technique was performed to assess early pregnancy. COMPARISON:  None Available. FINDINGS: Intrauterine gestational sac: Single Yolk sac:  Seen Embryo:  Seen Cardiac Activity: Seen Heart Rate: 161 bpm CRL:  18 mm   8 w    3 d                  [place] EDC: 07/02/2022 Subchorionic hemorrhage: There is small linear hypoechoic structure measuring 1.9 x 0.4 cm adjacent to the gestational sac suggesting small subchorionic bleed. Maternal uterus/adnexae: There is 2.9 cm cyst in the right ovary, possibly functional cyst. Cervix is closed measuring 2.5 cm. IMPRESSION: Single live intrauterine pregnancy is seen. Sonographically estimated gestational age is 8 weeks 3 days. Small subchorionic bleed is noted adjacent to the gestational sac. 2.9 cm cyst in the right ovary may suggest functional cyst. Electronically Signed   By: Elmer Picker M.D.   On: 11/23/2021 09:16   US OB Transvaginal  Result Date: 11/23/2021 CLINICAL DATA:  Vaginal bleeding EXAM: OBSTETRIC <14 WK [place] AND TRANSVAGINAL OB US TECHNIQUE: Both transabdominal and transvaginal ultrasound examinations were performed for complete evaluation of the gestation as well as the maternal uterus, adnexal regions, and pelvic cul-de-sac. Transvaginal technique was performed to assess early pregnancy. COMPARISON:  None Available. FINDINGS: Intrauterine gestational sac: Single Yolk sac:  Seen Embryo:  Seen Cardiac Activity: Seen Heart Rate: 161 bpm CRL:  18 mm   8 w   3 d                  [place] EDC: 07/02/2022 Subchorionic hemorrhage: There is small linear hypoechoic structure measuring 1.9 x 0.4 cm adjacent to the gestational sac suggesting small subchorionic bleed. Maternal uterus/adnexae: There is 2.9 cm cyst in the right ovary, possibly functional cyst. Cervix is closed measuring 2.5 cm. IMPRESSION: Single live intrauterine pregnancy is seen. Sonographically estimated gestational age is 8 weeks 3 days. Small subchorionic bleed is noted adjacent to the gestational sac. 2.9 cm cyst in the right ovary may suggest functional cyst. Electronically Signed   By: Elmer Picker M.D.   On: 11/23/2021 09:16    Procedures Procedures    Medications Ordered in ED Medications - No data to  display  ED Course/ Medical Decision Making/ A&P Clinical Course as of 11/23/21 0945  Thu Nov 23, 2021  0934 Patient has evidence of a small subchorionic hemorrhage.  Viable IUP noted on ultrasound [JK]  0945 Labs reviewed.  hCG is elevated at 243, 772.  Patient has been positive.  CBC and metabolic panel unremarkable. [JK]    Clinical Course User Index [JK] Dorie Rank, MD  Medical Decision Making Problems Addressed: Subchorionic hemorrhage of placenta in first trimester, single or unspecified fetus: acute illness or injury that poses a threat to life or bodily functions Threatened miscarriage: acute illness or injury that poses a threat to life or bodily functions  Amount and/or Complexity of Data Reviewed Labs: ordered. Decision-making details documented in ED Course.   Patient presented to the ED for evaluation of vaginal bleeding for semester pregnancy.  Exam is reassuring.  Her vitals are stable.  No signs of severe bleeding.  On exam no evidence of active bleeding from the cervical os.  Evidence of residual bleeding noted in the vaginal vault.  Ultrasound shows a viable IUP with a small subchorionic hemorrhage.  Presentation concerning for threatened miscarriage.  Discussed these findings with the patient.  Recommend close OB follow-up.  Evaluation and diagnostic testing in the emergency department does not suggest an emergent condition requiring admission or immediate intervention beyond what has been performed at this time.  The patient is safe for discharge and has been instructed to return immediately for worsening symptoms, change in symptoms or any other concerns.       Final Clinical Impression(s) / ED Diagnoses Final diagnoses:  Threatened miscarriage  Subchorionic hemorrhage of placenta in first trimester, single or unspecified fetus    Rx / DC Orders ED Discharge Orders     None         Dorie Rank, MD 11/23/21 [phone]

## 2021-11-23 NOTE — ED Provider Triage Note (Signed)
Emergency Medicine Provider Triage Evaluation Note  Loany K Hampton-Poole , a 36 y.o. female  was evaluated in triage.  Pt complains of vaginal bleeding.  G1P0 approx 7 weeks here with vaginal bleeding that began this morning.  States blood when wiping after urination and then passed large blood clot.  Does not currently have OB-GYN. She is taking prenatals.  Nausea without vomiting.  Has had prior US with confirmed IUP.  Review of Systems  Positive: Vaginal bleeding Negative: fever  Physical Exam  BP 122/80 (BP Location: Left Arm)   Pulse 63   Temp 98.7 F (37.1 C) (Oral)   Resp 17   Ht 5\' 4"  (1.626 m)   Wt 76.2 kg   SpO2 99%   BMI 28.84 kg/m  Gen:   Awake, no distress   Resp:  Normal effort  MSK:   Moves extremities without difficulty  Other:    Medical Decision Making  Medically screening exam initiated at 5:46 AM.  Appropriate orders placed.  Latrise K Hampton-Poole was informed that the remainder of the evaluation will be completed by another provider, this initial triage assessment does not replace that evaluation, and the importance of remaining in the ED until their evaluation is complete.  Vaginal bleeding in pregnancy.  Labs with Rh work-up, [place].   Larene Pickett, PA-C 11/23/21 [phone]

## 2021-11-23 NOTE — Discharge Instructions (Signed)
Follow-up with your OB/GYN doctor to be rechecked in the next couple of days.  Return as needed for worsening symptoms

## 2021-11-24 LAB — GC/CHLAMYDIA PROBE AMP (~~LOC~~) NOT AT ARMC
Chlamydia: NEGATIVE
Comment: NEGATIVE
Comment: NORMAL
Neisseria Gonorrhea: NEGATIVE

## 2021-12-06 ENCOUNTER — Telehealth: Payer: 59

## 2021-12-11 ENCOUNTER — Emergency Department (HOSPITAL_COMMUNITY): Payer: 59

## 2021-12-11 ENCOUNTER — Emergency Department (HOSPITAL_COMMUNITY)
Admission: EM | Admit: 2021-12-11 | Discharge: 2021-12-11 | Disposition: A | Discharge status: Home / Self Care | Payer: 59 | Attending: Emergency Medicine | Admitting: Emergency Medicine

## 2021-12-11 ENCOUNTER — Other Ambulatory Visit: Payer: Self-pay

## 2021-12-11 ENCOUNTER — Ambulatory Visit
Admission: EM | Admit: 2021-12-11 | Discharge: 2021-12-11 | Disposition: A | Discharge status: Home / Self Care | Payer: 59

## 2021-12-11 ENCOUNTER — Encounter (HOSPITAL_COMMUNITY): Payer: Self-pay

## 2021-12-11 DIAGNOSIS — Z3A10 10 weeks gestation of pregnancy: Secondary | ICD-10-CM | POA: Diagnosis not present

## 2021-12-11 DIAGNOSIS — O208 Other hemorrhage in early pregnancy: Secondary | ICD-10-CM | POA: Diagnosis not present

## 2021-12-11 DIAGNOSIS — O418X10 Other specified disorders of amniotic fluid and membranes, first trimester, not applicable or unspecified: Secondary | ICD-10-CM

## 2021-12-11 DIAGNOSIS — O26851 Spotting complicating pregnancy, first trimester: Secondary | ICD-10-CM | POA: Diagnosis present

## 2021-12-11 LAB — CBC WITH DIFFERENTIAL/PLATELET
Abs Immature Granulocytes: 0.07 10*3/uL (ref 0.00–0.07)
Basophils Absolute: 0.1 10*3/uL (ref 0.0–0.1)
Basophils Relative: 1 %
Eosinophils Absolute: 0 10*3/uL (ref 0.0–0.5)
Eosinophils Relative: 0 %
HCT: 48.1 % — ABNORMAL HIGH (ref 36.0–46.0)
Hemoglobin: 15.4 g/dL — ABNORMAL HIGH (ref 12.0–15.0)
Immature Granulocytes: 1 %
Lymphocytes Relative: 20 %
Lymphs Abs: 2 10*3/uL (ref 0.7–4.0)
MCH: 29.1 pg (ref 26.0–34.0)
MCHC: 32 g/dL (ref 30.0–36.0)
MCV: 90.8 fL (ref 80.0–100.0)
Monocytes Absolute: 0.8 10*3/uL (ref 0.1–1.0)
Monocytes Relative: 8 %
Neutro Abs: 7.2 10*3/uL (ref 1.7–7.7)
Neutrophils Relative %: 70 %
Platelets: 350 10*3/uL (ref 150–400)
RBC: 5.3 MIL/uL — ABNORMAL HIGH (ref 3.87–5.11)
RDW: 13.7 % (ref 11.5–15.5)
WBC: 10.2 10*3/uL (ref 4.0–10.5)
nRBC: 0 % (ref 0.0–0.2)

## 2021-12-11 NOTE — Discharge Instructions (Signed)
Your results today are reassuring. Growth of the fetus is as expected for dates. The subchorionic hemorrhage is still present.  Your blood work is stable, your vitals are stable.  Recommend follow-up with your OB as planned.  As discussed, you may return to the emergency room at any time for [facility] care however you may also presented to the women's and children's entrance at [facility] for [facility] care as well.

## 2021-12-11 NOTE — ED Notes (Signed)
Patient going to the ED per provider recommendations.

## 2021-12-11 NOTE — ED Provider Triage Note (Signed)
Emergency Medicine Provider Triage Evaluation Note  Caris K Hampton-Poole , a 36 y.o. female  was evaluated in triage.  Pt complains of vaginal bleeding onset yesterday, called nurse line and told to go to UC, UC sent to ER. Reports spotting, not heavy currently, Seen here earlier this month, told subchorionic hemorrhage   Review of Systems  Positive: As above Negative: As above  Physical Exam  BP 138/81 (BP Location: Left Arm)   Pulse 91   Temp 99 F (37.2 C) (Oral)   Resp 16   Ht 5\' 5"  (1.651 m)   Wt 77.1 kg   SpO2 99%   BMI 28.29 kg/m  Gen:   Awake, no distress   Resp:  Normal effort  MSK:   Moves extremities without difficulty  Other:    Medical Decision Making  Medically screening exam initiated at 11:04 AM.  Appropriate orders placed.  Leyah K Hampton-Poole was informed that the remainder of the evaluation will be completed by another provider, this initial triage assessment does not replace that evaluation, and the importance of remaining in the ED until their evaluation is complete.  Labs and imaging reviewed. Rh B+ on last visit.    Tacy Learn, PA-C 12/11/21 1106

## 2021-12-11 NOTE — ED Provider Notes (Signed)
[facility]-EMERGENCY DEPT Provider Note   CSN: [id number] Arrival date & time: 12/11/21  6222     History  No chief complaint on file.   Ana Taylor is a 36 y.o. female.  Pt complains of vaginal bleeding onset yesterday, called nurse line and told to go to UC, UC sent to ER. Reports spotting, not heavy currently, Seen here earlier this month, told subchorionic hemorrhage.  Is scheduled to establish care with OB however this is not scheduled until next month.  Patient is G1, P0.       Home Medications Prior to Admission medications   Medication Sig Start Date End Date Taking? Authorizing Provider  metroNIDAZOLE (METROGEL VAGINAL) 0.75 % vaginal gel Place 1 Applicatorful vaginally 2 (two) times daily. 11/10/21   Lamptey, Britta Mccreedy, MD      Allergies    Patient has no known allergies.    Review of Systems   Review of Systems Negative except as per HPI Physical Exam Updated Vital Signs BP 138/81 (BP Location: Left Arm)   Pulse 91   Temp 99 F (37.2 C) (Oral)   Resp 16   Ht 5\' 5"  (1.651 m)   Wt 77.1 kg   SpO2 99%   BMI 28.29 kg/m  Physical Exam Vitals and nursing note reviewed.  Constitutional:      General: She is not in acute distress.    Appearance: She is well-developed. She is not diaphoretic.  HENT:     Head: Normocephalic and atraumatic.  Pulmonary:     Effort: Pulmonary effort is normal.  Abdominal:     Palpations: Abdomen is soft.     Tenderness: There is no abdominal tenderness.  Skin:    General: Skin is warm and dry.     Findings: No erythema or rash.  Neurological:     Mental Status: She is alert and oriented to person, place, and time.  Psychiatric:        Behavior: Behavior normal.     ED Results / Procedures / Treatments   Labs (all labs ordered are listed, but only abnormal results are displayed) Labs Reviewed  CBC WITH DIFFERENTIAL/PLATELET - Abnormal; Notable for the following components:       Result Value   RBC 5.30 (*)    Hemoglobin 15.4 (*)    HCT 48.1 (*)    All other components within normal limits    EKG None  Radiology OB Comp < 14 Wks  Result Date: 12/11/2021 CLINICAL DATA:  36 year old pregnant female. 2 weeks ago quantitative beta HCG: 30. Vaginal bleeding. Unsure of last menstrual period. EXAM: OBSTETRIC <14 WK [id number] AND TRANSVAGINAL OB US TECHNIQUE: Both transabdominal and transvaginal ultrasound examinations were performed for complete evaluation of the gestation as well as the maternal uterus, adnexal regions, and pelvic cul-de-sac. Transvaginal technique was performed to assess early pregnancy. COMPARISON:  Early Ob ultrasound 11/23/2021 FINDINGS: Intrauterine gestational sac: Single Yolk sac:  Visualized. Embryo:  Visualized. Cardiac Activity: Visualized. Heart Rate: Recorded by sonographer at 167 beats per minute. CRL:  20.0 mm   10 w   6 d                  01/23/2022 EDC: 07/03/2022 Subchorionic hemorrhage: Small subchorionic hematoma measuring up to approximately 2.0 x 0.4 cm, similar to prior. Maternal uterus/adnexae: Slight interval decrease in anechoic cyst within the right ovary measuring up to 2.5 x 2.2 x 2.4 cm, previously 2.9 x 2.8 x 2.6 cm.  No free fluid within the cul-de-sac. IMPRESSION: 1. Single live intrauterine pregnancy with crown-rump length corresponding to 10 weeks 6 days estimated gestational age. This corresponds to appropriate interval growth from prior 11/23/2021 ultrasound. 2. Small subchorionic hematoma is similar to prior and within normal limits at this time. Consider attention on follow-up. Electronically Signed   By: Yvonne Kendall M.D.   On: 12/11/2021 12:28    Procedures Procedures    Medications Ordered in ED Medications - No data to display  ED Course/ Medical Decision Making/ A&P                           Medical Decision Making Amount and/or Complexity of Data Reviewed Labs: ordered. Radiology: ordered.   36 year old female  presents with concern for vaginal bleeding in early pregnancy.  Bleeding started yesterday, described as spotting, called nurse line and was advised go to urgent care.  Patient went to urgent care today and was directed to the ER.  She is well-appearing, in no distress.  Reports using light pads, not soaking through.  Patient was in the emergency room earlier this week with similar concerns, had an ultrasound showing viable IUP and small subchorionic hemorrhage.  Patient has not established care with OB as of yet, is scheduled to be seen early next month.  Vitals reviewed and reassuring.  CBC without significant change compared to prior from her earlier visit this month.  Ultrasound shows single IUP measuring 10 weeks 6 days with a small subchorionic hematoma similar to prior.  Offered reassurance, reviewed results.  Encouraged to follow-up with OB.  Advised to return to ER for severe or concerning symptoms, also discussed women's [facility] children[facility] as a resource where she could obtain OB care if needed for emergencies.        Final Clinical Impression(s) / ED Diagnoses Final diagnoses:  Subchorionic hemorrhage of placenta in first trimester, single or unspecified fetus    Rx / DC Orders ED Discharge Orders     None         Tacy Learn, PA-C 12/11/21 1323    Tretha Sciara, MD 12/11/21 1330

## 2021-12-11 NOTE — ED Triage Notes (Signed)
Patient c/o vaginal bleeding since yesterday with small blood clots. Patient went to a [facility]. Patient states she was seen on 11/23/21 for the same and was on bedrest for a while.

## 2021-12-11 NOTE — ED Triage Notes (Signed)
Patient presents to [facility] for vaginal bleeding since yesterday. She noted some small blood clots. She reports speaking to triage nurse who instructed her to come in for eval at [facility]. She states she had similar episode 10/05 and was seen in the ED. She states she is also concerned with BV, has had some vaginal discharge since Friday.

## 2021-12-14 ENCOUNTER — Encounter: Payer: Self-pay | Admitting: Advanced Practice Midwife

## 2021-12-14 ENCOUNTER — Inpatient Hospital Stay (HOSPITAL_COMMUNITY)
Admission: AD | Admit: 2021-12-14 | Discharge: 2021-12-14 | Disposition: A | Discharge status: Home / Self Care | Payer: 59 | Attending: Obstetrics and Gynecology | Admitting: Obstetrics and Gynecology

## 2021-12-14 DIAGNOSIS — O10911 Unspecified pre-existing hypertension complicating pregnancy, first trimester: Secondary | ICD-10-CM | POA: Insufficient documentation

## 2021-12-14 DIAGNOSIS — O99281 Endocrine, nutritional and metabolic diseases complicating pregnancy, first trimester: Secondary | ICD-10-CM | POA: Diagnosis not present

## 2021-12-14 DIAGNOSIS — O418X10 Other specified disorders of amniotic fluid and membranes, first trimester, not applicable or unspecified: Secondary | ICD-10-CM

## 2021-12-14 DIAGNOSIS — O468X1 Other antepartum hemorrhage, first trimester: Secondary | ICD-10-CM

## 2021-12-14 DIAGNOSIS — O208 Other hemorrhage in early pregnancy: Secondary | ICD-10-CM | POA: Insufficient documentation

## 2021-12-14 DIAGNOSIS — O219 Vomiting of pregnancy, unspecified: Secondary | ICD-10-CM | POA: Diagnosis not present

## 2021-12-14 DIAGNOSIS — Z3A11 11 weeks gestation of pregnancy: Secondary | ICD-10-CM

## 2021-12-14 MED ORDER — ONDANSETRON 4 MG PO TBDP: 4.0000 mg | ORAL_TABLET | Freq: Four times a day (QID) | ORAL | 0 refills | Status: DC | PRN

## 2021-12-14 MED ORDER — PRENATAL VITAMIN 27-0.8 MG PO TABS: 1.0000 | ORAL_TABLET | Freq: Every day | ORAL | 12 refills | Status: DC

## 2021-12-14 MED ORDER — DOXYLAMINE-PYRIDOXINE 10-10 MG PO TBEC: 2.0000 | DELAYED_RELEASE_TABLET | Freq: Every evening | ORAL | 5 refills | Status: DC | PRN

## 2021-12-14 NOTE — MAU Note (Signed)
.  Ana Taylor is a 36 y.o. at [redacted]w[redacted]d here in MAU reporting: ongoing nausea with vomiting yesterday and continue VB with clotting. Pt reports she got up at 0610 to use the bathroom and blood filled the toilet, has filled one pad since and currently wearing a pad. Pt when to [facility] ED on 10/5 and 10/23 and was diagnosed with a Subchorionic Hemorrhage via [place]. Pt was not told what to expect, and noticed that the bleeding is increased when she is up and moved not on bedrest. Pt denies LOF.    Onset of complaint: ongoing  Pain score: 0/10 Vitals:   12/14/21 0647  BP: 127/77  Pulse: 77  Resp: 18  Temp: 98.2 F (36.8 C)  SpO2: 99%     LXB:WIOMBT to assess, RN attempted Lab orders placed from triage:  ua

## 2021-12-14 NOTE — MAU Provider Note (Signed)
Chief Complaint: Vaginal Bleeding, Nausea, and Emesis   Event Date/Time   First Provider Initiated Contact with Patient 12/14/21 [phone]        SUBJECTIVE HPI: Ana Taylor is a 36 y.o. G1P0 at [redacted]w[redacted]d by LMP who presents to maternity admissions reporting persistent vaginal bleeding (light) and nausea.  States ED provider told her to come in every time she sees bleeding.  They did not give her Rx for nausea "because I wasn't vomiting".. She denies vaginal itching/burning, urinary symptoms, h/a, dizziness, or fever/chills.     Vaginal Bleeding The patient's primary symptoms include vaginal bleeding. The patient's pertinent negatives include no genital itching or genital odor. This is a recurrent problem. The problem has been waxing and waning. The patient is experiencing no pain. She is pregnant. Associated symptoms include nausea and vomiting. Pertinent negatives include no abdominal pain, chills, constipation or diarrhea. The vaginal discharge was bloody. The vaginal bleeding is lighter than menses. She has been passing clots (one small clot). She has not been passing tissue. Nothing aggravates the symptoms. She has tried nothing for the symptoms.  Emesis  Pertinent negatives include no abdominal pain, chills or diarrhea.    Past Medical History:  Diagnosis Date   Arthritis    Hypercholesterolemia    Hypertension    No past surgical history on file. Social History   Socioeconomic History   Marital status: Married    Spouse name: Not on file   Number of children: Not on file   Years of education: Not on file   Highest education level: Not on file  Occupational History   Not on file  Tobacco Use   Smoking status: Former   Smokeless tobacco: Not on file  Vaping Use   Vaping Use: Never used  Substance and Sexual Activity   Alcohol use: Yes   Drug use: Not on file   Sexual activity: Yes    Birth control/protection: Pill  Other Topics Concern   Not on file  Social  History Narrative   Not on file   Social Determinants of Health   Financial Resource Strain: Not on file  Food Insecurity: Not on file  Transportation Needs: Not on file  Physical Activity: Not on file  Stress: Not on file  Social Connections: Not on file  Intimate Partner Violence: Not on file   No current facility-administered medications on file prior to encounter.   Current Outpatient Medications on File Prior to Encounter  Medication Sig Dispense Refill   metroNIDAZOLE (METROGEL VAGINAL) 0.75 % vaginal gel Place 1 Applicatorful vaginally 2 (two) times daily. 70 g 0   No Known Allergies  I have reviewed patient's Past Medical Hx, Surgical Hx, Family Hx, Social Hx, medications and allergies.   ROS:  Review of Systems  Constitutional:  Negative for chills.  Gastrointestinal:  Positive for nausea and vomiting. Negative for abdominal pain, constipation and diarrhea.  Genitourinary:  Positive for vaginal bleeding.   Review of Systems  Other systems negative   Physical Exam  Physical Exam Patient Vitals for the past 24 hrs:  BP Temp Temp src Pulse Resp SpO2 Height Weight  12/14/21 0647 127/77 98.2 F (36.8 C) Oral 77 18 99 % 5\' 5"  (1.651 m) 77.4 kg   Constitutional: Well-developed, well-nourished female in no acute distress.  Cardiovascular: normal rate Respiratory: normal effort GI: Abd soft, non-tender.  MS: Extremities nontender, no edema, normal ROM Neurologic: Alert and oriented x 4.  GU: Neg CVAT.  LAB RESULTS No  results found for this or any previous visit (from the past 24 hour(s)).  --/--/B POS (10/05 0555)  IMAGING Pt informed that the ultrasound is considered a limited OB ultrasound and is not intended to be a complete ultrasound exam.  Patient also informed that the ultrasound is not being completed with the intent of assessing for fetal or placental anomalies or any pelvic abnormalities.  Explained that the purpose of today's ultrasound is to assess  for viability.  Patient acknowledges the purpose of the exam and the limitations of the study.    Bedside US done for FHR assessment FHR 160 per [place] Normal appearing fetus visualized  MAU Management/MDM: I have reviewed the triage vital signs and the nursing notes.   Pertinent labs & imaging results that were available during my care of the patient were reviewed by me and considered in my medical decision making (see chart for details).      I have reviewed her medical records including past results, notes and treatments.   Discussed nature of persistent bleeding with [facility] Discussed small subchorionic hemorrhages are not associated with SAB Discussed remedies for nausea   ASSESSMENT Single IUP at [redacted]w[redacted]d Nausea Subchorionic hemorrhage with bleeding  PLAN Discharge home Rx Diclegis and Zofran for use when cannot be sleepy Rx Prenatal vitamin Encouraged to start prenatal care  Pt stable at time of discharge. Encouraged to return here if she develops worsening of symptoms, increase in pain, fever, or other concerning symptoms.    Hansel Feinstein CNM, MSN Certified Nurse-Midwife 12/14/2021  7:22 AM

## 2021-12-18 ENCOUNTER — Encounter: Payer: Self-pay | Admitting: *Deleted

## 2021-12-20 ENCOUNTER — Telehealth (INDEPENDENT_AMBULATORY_CARE_PROVIDER_SITE_OTHER): Payer: 59

## 2021-12-20 DIAGNOSIS — E282 Polycystic ovarian syndrome: Secondary | ICD-10-CM

## 2021-12-20 DIAGNOSIS — O09511 Supervision of elderly primigravida, first trimester: Secondary | ICD-10-CM | POA: Insufficient documentation

## 2021-12-20 DIAGNOSIS — Z3689 Encounter for other specified antenatal screening: Secondary | ICD-10-CM

## 2021-12-20 DIAGNOSIS — Z8742 Personal history of other diseases of the female genital tract: Secondary | ICD-10-CM

## 2021-12-20 DIAGNOSIS — O0990 Supervision of high risk pregnancy, unspecified, unspecified trimester: Secondary | ICD-10-CM | POA: Insufficient documentation

## 2021-12-20 NOTE — Progress Notes (Signed)
New OB Intake  I connected with Ana Taylor on 12/20/21 at 11:15 AM EDT by MyChart Video Visit and verified that I am speaking with the correct person using two identifiers. Nurse is located at [facility] and pt is located at home.  I discussed the limitations, risks, security and privacy concerns of performing an evaluation and management service by telephone and the availability of in person appointments. I also discussed with the patient that there may be a patient responsible charge related to this service. The patient expressed understanding and agreed to proceed.  I explained I am completing New OB Intake today. We discussed EDD of 07/02/2022 that is based on ultrasound on 0/06/2021. Pt is G1/P0. I reviewed her allergies, medications, Medical/Surgical/OB history, and appropriate screenings. I informed her of [facility] services. [facility] information placed in AVS. Based on history, this is a high risk pregnancy.  Patient Active Problem List   Diagnosis Date Noted   PCOS (polycystic ovarian syndrome) 12/20/2021   History of abnormal cervical Pap smear 12/20/2021   Supervision of high risk pregnancy, antepartum 12/20/2021   AMA (advanced maternal age) primigravida 63+, first trimester 12/20/2021     Concerns addressed today  Delivery Plans Plans to deliver at [facility]. Patient given information for [facility] Healthy Baby website for more information about [facility] [place]. Patient is not interested in water birth. Offered upcoming OB visit with CNM to discuss further.  MyChart/Babyscripts MyChart access verified. I explained pt will have some visits in office and some virtually. Babyscripts instructions given and order placed. Patient verifies receipt of registration text/e-mail. Account successfully created and app downloaded.  Blood Pressure Cuff/Weight Scale Patient has private insurance; instructed to purchase blood pressure cuff and bring to first prenatal appt. Explained  after first prenatal appt pt will check weekly and document in 71. Patient does not have weight scale; patient may purchase if they desire to track weight weekly in Babyscripts.  Anatomy US Explained first scheduled [place] will be around 19 weeks. Anatomy US scheduled for 02/05/2022 at 08:30 AM. Pt notified to arrive at 08:15 AM.  Labs Discussed Natera genetic screening with patient. Would like both Panorama and Horizon drawn at new OB visit. Routine prenatal labs needed.  COVID Vaccine Patient has not had COVID vaccine.   Is patient a CenteringPregnancy candidate?  Declined Declined due to Enrolled in [facility]    Is patient a Mom+Baby Combined Care candidate?  Accepted   If accepted, Mom+Baby staff notified  Social Determinants of Health Food Insecurity: Patient denies food insecurity. WIC Referral: Patient is interested in referral to [facility].  Transportation: Patient denies transportation needs. Childcare: Discussed no children allowed at ultrasound appointments. Offered childcare services; patient declines childcare services at this time.  First visit review I reviewed new OB appt with patient. I explained they will have a provider visit that includes initial ob labs, genetic screening, and may require pap smear. Explained pt will be seen by Clarnce Flock MD at first visit; encounter routed to appropriate provider. Explained that patient will be seen by pregnancy navigator following visit with provider.   Mariane Baumgarten, [place] 12/20/2021  11:21 AM

## 2021-12-21 ENCOUNTER — Other Ambulatory Visit: Payer: Self-pay

## 2021-12-21 ENCOUNTER — Encounter (HOSPITAL_COMMUNITY): Payer: Self-pay | Admitting: Obstetrics and Gynecology

## 2021-12-21 ENCOUNTER — Inpatient Hospital Stay (HOSPITAL_COMMUNITY)
Admission: AD | Admit: 2021-12-21 | Discharge: 2021-12-21 | Disposition: A | Discharge status: Home / Self Care | Payer: 59 | Attending: Obstetrics and Gynecology | Admitting: Obstetrics and Gynecology

## 2021-12-21 DIAGNOSIS — O09511 Supervision of elderly primigravida, first trimester: Secondary | ICD-10-CM

## 2021-12-21 DIAGNOSIS — O99281 Endocrine, nutritional and metabolic diseases complicating pregnancy, first trimester: Secondary | ICD-10-CM | POA: Insufficient documentation

## 2021-12-21 DIAGNOSIS — Z3A12 12 weeks gestation of pregnancy: Secondary | ICD-10-CM | POA: Insufficient documentation

## 2021-12-21 DIAGNOSIS — O0990 Supervision of high risk pregnancy, unspecified, unspecified trimester: Secondary | ICD-10-CM

## 2021-12-21 DIAGNOSIS — O039 Complete or unspecified spontaneous abortion without complication: Secondary | ICD-10-CM

## 2021-12-21 DIAGNOSIS — O26891 Other specified pregnancy related conditions, first trimester: Secondary | ICD-10-CM | POA: Insufficient documentation

## 2021-12-21 DIAGNOSIS — Z8742 Personal history of other diseases of the female genital tract: Secondary | ICD-10-CM

## 2021-12-21 DIAGNOSIS — E282 Polycystic ovarian syndrome: Secondary | ICD-10-CM

## 2021-12-21 DIAGNOSIS — O209 Hemorrhage in early pregnancy, unspecified: Secondary | ICD-10-CM | POA: Diagnosis not present

## 2021-12-21 LAB — CBC
HCT: 40.2 % (ref 36.0–46.0)
Hemoglobin: 13.9 g/dL (ref 12.0–15.0)
MCH: 30.2 pg (ref 26.0–34.0)
MCHC: 34.6 g/dL (ref 30.0–36.0)
MCV: 87.4 fL (ref 80.0–100.0)
Platelets: 257 10*3/uL (ref 150–400)
RBC: 4.6 MIL/uL (ref 3.87–5.11)
RDW: 13.2 % (ref 11.5–15.5)
WBC: 17.9 10*3/uL — ABNORMAL HIGH (ref 4.0–10.5)
nRBC: 0 % (ref 0.0–0.2)

## 2021-12-21 MED ORDER — KETOROLAC TROMETHAMINE 30 MG/ML IJ SOLN
30.0000 mg | Freq: Once | INTRAMUSCULAR | Status: AC
  Administered 2021-12-21: 30 mg via INTRAMUSCULAR
  Filled 2021-12-21: qty 1

## 2021-12-21 MED ORDER — OXYCODONE HCL 5 MG/5ML PO SOLN
10.0000 mg | Freq: Once | ORAL | Status: AC
  Administered 2021-12-21: 10 mg via ORAL
  Filled 2021-12-21: qty 10

## 2021-12-21 NOTE — Progress Notes (Signed)
Dr. Dione Plover into see pt and eval VB.  Bedside U/S done.  No IUP noted per Dr. Dione Plover.

## 2021-12-21 NOTE — MAU Provider Note (Signed)
History     [id number]  Arrival date and time: 12/21/21 1050    Chief Complaint  Patient presents with   Vaginal Bleeding   Abdominal Pain     HPI Ana Taylor is a 36 y.o. at [redacted]w[redacted]d by first trimester [place] with PMHx notable for AMA, who presents for vaginal bleeding.   Patient seen previously in MAU on 12/14/2021 for vaginal bleeding, live IUP on [place] with small subchorionic hemorrhage noted  Today reports she began to have bleeding around 0400 This began to get heavier around 0700, she began to pass clots While in the MAU bathroom she passed a large amount of tissue Endorses ongoing pain and cramping in her lower abdomen and hips Bleeding has slowed   --/--/B POS (10/05 0555)  OB History     Gravida  1   Para      Term      Preterm      AB      Living         SAB      IAB      Ectopic      Multiple      Live Births              Past Medical History:  Diagnosis Date   Hypercholesterolemia    PCOS (polycystic ovarian syndrome)     Past Surgical History:  Procedure Laterality Date   LEEP      Family History  Problem Relation Age of Onset   Arthritis Mother    Diabetes Mother    Heart failure Mother    Diabetes Father    Heart disease Father    Heart attack Father    Hypertension Father    Clotting disorder Father     Social History   Socioeconomic History   Marital status: Married    Spouse name: Not on file   Number of children: Not on file   Years of education: Not on file   Highest education level: Not on file  Occupational History   Not on file  Tobacco Use   Smoking status: Former   Smokeless tobacco: Not on file  Vaping Use   Vaping Use: Never used  Substance and Sexual Activity   Alcohol use: Not Currently   Drug use: Never   Sexual activity: Yes  Other Topics Concern   Not on file  Social History Narrative   Not on file   Social Determinants of Health   Financial Resource Strain: Not on file   Food Insecurity: Not on file  Transportation Needs: Not on file  Physical Activity: Not on file  Stress: Not on file  Social Connections: Not on file  Intimate Partner Violence: Not on file    No Known Allergies  No current facility-administered medications on file prior to encounter.   Current Outpatient Medications on File Prior to Encounter  Medication Sig Dispense Refill   Prenatal Vit-Fe Fumarate-FA (PRENATAL VITAMIN) 27-0.8 MG TABS Take 1 tablet by mouth daily at 6 (six) AM. 30 tablet 12   Doxylamine-Pyridoxine (DICLEGIS) 10-10 MG TBEC Take 2 tablets by mouth at bedtime as needed (nausea). 60 tablet 5   ondansetron (ZOFRAN-ODT) 4 MG disintegrating tablet Take 1 tablet (4 mg total) by mouth every 6 (six) hours as needed for nausea. 20 tablet 0     ROS Pertinent positives and negative per HPI, all others reviewed and negative  Physical Exam   BP (!) 147/83 (BP Location: Right  Arm)   Pulse 95   Temp 99.1 F (37.3 C) (Oral)   Resp 18   SpO2 98%   Patient Vitals for the past 24 hrs:  BP Temp Temp src Pulse Resp SpO2  12/21/21 1232 -- -- -- -- -- 98 %  12/21/21 1148 (!) 147/83 -- -- 95 -- 98 %  12/21/21 1132 134/77 99.1 F (37.3 C) Oral 82 18 99 %    Physical Exam Vitals reviewed.  Constitutional:      General: She is not in acute distress.    Appearance: She is well-developed. She is not diaphoretic.  Eyes:     General: No scleral icterus. Pulmonary:     Effort: Pulmonary effort is normal. No respiratory distress.  Abdominal:     General: There is no distension.     Palpations: Abdomen is soft.     Tenderness: There is abdominal tenderness in the right lower quadrant, suprapubic area and left lower quadrant. There is no guarding or rebound.  Skin:    General: Skin is warm and dry.  Neurological:     Mental Status: She is alert.     Coordination: Coordination normal.      Cervical Exam    Bedside Ultrasound Pt informed that the ultrasound is  considered a limited OB ultrasound and is not intended to be a complete ultrasound exam.  Patient also informed that the ultrasound is not being completed with the intent of assessing for fetal or placental anomalies or any pelvic abnormalities.  Explained that the purpose of today's ultrasound is to assess for   retained products .  Patient acknowledges the purpose of the exam and the limitations of the study.    My interpretation: no IUP visualized, mildly enlarged uterus c/w early second trimester, no significant distension of endometrial cavity, no evidence of color flow in endometrial cavity   Labs Results for orders placed or performed during the hospital encounter of 12/21/21 (from the past 24 hour(s))  CBC     Status: Abnormal   Collection Time: 12/21/21 12:04 PM  Result Value Ref Range   WBC 17.9 (H) 4.0 - 10.5 K/uL   RBC 4.60 3.87 - 5.11 MIL/uL   Hemoglobin 13.9 12.0 - 15.0 g/dL   HCT 40.2 36.0 - 46.0 %   MCV 87.4 80.0 - 100.0 fL   MCH 30.2 26.0 - 34.0 pg   MCHC 34.6 30.0 - 36.0 g/dL   RDW 13.2 11.5 - 15.5 %   Platelets 257 150 - 400 K/uL   nRBC 0.0 0.0 - 0.2 %    Imaging No results found.  MAU Course  Procedures Lab Orders         CBC     Meds ordered this encounter  Medications   ketorolac (TORADOL) 30 MG/ML injection 30 mg   oxyCODONE (ROXICODONE) 5 MG/5ML solution 10 mg   Imaging Orders  No imaging studies ordered today    MDM moderate  Assessment and Plan  #SAB #[redacted] weeks gestation of pregnancy Patient passed large amount of tissue consistent with POC's while in MAU bathroom. No active bleeding at present. Bedside US without any signs of retained POC's. Offered ANORA testing, she declines. Offered cytotec which she also declines. Will send POC's to pathology. CBC shows normal hgb. Treated with IM toradol and liquid oxycodone (reports difficulty with pills) with good effect. Blood type B+, rhogam not indicated. Plan for two week SAB follow up in clinic.     Dispo: discharged to  home in stable condition.   Venora Maples, MD/MPH 12/21/21 1:22 PM  Allergies as of 12/21/2021   No Known Allergies      Medication List     TAKE these medications    Doxylamine-Pyridoxine 10-10 MG Tbec Commonly known as: Diclegis Take 2 tablets by mouth at bedtime as needed (nausea).   ondansetron 4 MG disintegrating tablet Commonly known as: ZOFRAN-ODT Take 1 tablet (4 mg total) by mouth every 6 (six) hours as needed for nausea.   Prenatal Vitamin 27-0.8 MG Tabs Take 1 tablet by mouth daily at 6 (six) AM.

## 2021-12-21 NOTE — MAU Note (Signed)
Ana Taylor is a 36 y.o. at [redacted]w[redacted]d here in MAU reporting: she began having VB this morning @ 0400 this morning which became heavier @ 0700.  Reports began passing large clots.  Also states having abdominal cramping.  Reports thinks she's miscarried. LMP: N/A Onset of complaint: today Pain score: 10 Vitals:   12/21/21 1132  BP: 134/77  Pulse: 82  Resp: 18  Temp: 99.1 F (37.3 C)  SpO2: 99%     FHT:N/A Lab orders placed from triage:   None

## 2021-12-25 LAB — SURGICAL PATHOLOGY

## 2021-12-27 ENCOUNTER — Encounter: Payer: 59 | Admitting: Family Medicine

## 2022-01-03 ENCOUNTER — Encounter: Payer: Self-pay | Admitting: Family Medicine

## 2022-01-03 ENCOUNTER — Other Ambulatory Visit (HOSPITAL_COMMUNITY)
Admission: RE | Admit: 2022-01-03 | Discharge: 2022-01-03 | Disposition: A | Discharge status: Home / Self Care | Payer: 59 | Source: Ambulatory Visit | Attending: Family Medicine | Admitting: Family Medicine

## 2022-01-03 ENCOUNTER — Ambulatory Visit: Payer: 59 | Admitting: Family Medicine

## 2022-01-03 VITALS — BP 148/92 | HR 111 | Wt 172.4 lb

## 2022-01-03 DIAGNOSIS — O039 Complete or unspecified spontaneous abortion without complication: Secondary | ICD-10-CM | POA: Diagnosis not present

## 2022-01-03 DIAGNOSIS — Z124 Encounter for screening for malignant neoplasm of cervix: Secondary | ICD-10-CM | POA: Diagnosis present

## 2022-01-03 NOTE — Progress Notes (Signed)
Last passing of clot/blood was last Friday and now only having scant/spotting bleeding.

## 2022-01-03 NOTE — Progress Notes (Signed)
GYNECOLOGY OFFICE VISIT NOTE  History:   Ana Taylor is a 36 y.o. G1P0 here today for SAB follow up.  I saw patient in MAU on 12/21/2021, at that time diagnosed with SAB, declined ANORA and cytotec at that time Pathology did show POC's  Today reports mild cramps and vaginal spotting, overall feels good Thinking about trying again soon  Health Maintenance Due  Topic Date Due   HEMOGLOBIN A1C  Never done   FOOT EXAM  Never done   OPHTHALMOLOGY EXAM  Never done   HIV Screening  Never done   Diabetic kidney evaluation - Urine ACR  Never done   Hepatitis C Screening  Never done   TETANUS/TDAP  Never done   PAP SMEAR-Modifier  Never done    Past Medical History:  Diagnosis Date   Hypercholesterolemia    PCOS (polycystic ovarian syndrome)     Past Surgical History:  Procedure Laterality Date   LEEP      The following portions of the patient's history were reviewed and updated as appropriate: allergies, current medications, past family history, past medical history, past social history, past surgical history and problem list.   Health Maintenance:   Last pap: No results found for: "DIAGPAP", "HPV", "HPVHIGH" None on file  Last mammogram:  N/a    Review of Systems:  Pertinent items noted in HPI and remainder of comprehensive ROS otherwise negative.  Physical Exam:  BP (!) 148/92   Pulse (!) 111   Wt 172 lb 6.4 oz (78.2 kg)   LMP  (LMP Unknown)   Breastfeeding No   BMI 28.69 kg/m  CONSTITUTIONAL: Well-developed, well-nourished female in no acute distress.  HEENT:  Normocephalic, atraumatic. External right and left ear normal. No scleral icterus.  NECK: Normal range of motion, supple, no masses noted on observation SKIN: No rash noted. Not diaphoretic. No erythema. No pallor. MUSCULOSKELETAL: Normal range of motion. No edema noted. NEUROLOGIC: Alert and oriented to person, place, and time. Normal muscle tone coordination.  PSYCHIATRIC: Normal mood  and affect. Normal behavior. Normal judgment and thought content. RESPIRATORY: Effort normal, no problems with respiration noted PELVIC: Normal appearing external genitalia; normal appearing vaginal mucosa and cervix.  Small amount of mucousy, mildly bloody discharge  Labs and Imaging No results found for this or any previous visit (from the past 168 hour(s)). US OB Comp < 14 Wks  Result Date: 12/11/2021 CLINICAL DATA:  36 year old pregnant female. 2 weeks ago quantitative beta HCG: 170017. Vaginal bleeding. Unsure of last menstrual period. EXAM: OBSTETRIC <14 WK [place] AND TRANSVAGINAL OB US TECHNIQUE: Both transabdominal and transvaginal ultrasound examinations were performed for complete evaluation of the gestation as well as the maternal uterus, adnexal regions, and pelvic cul-de-sac. Transvaginal technique was performed to assess early pregnancy. COMPARISON:  Early Ob ultrasound 11/23/2021 FINDINGS: Intrauterine gestational sac: Single Yolk sac:  Visualized. Embryo:  Visualized. Cardiac Activity: Visualized. Heart Rate: Recorded by sonographer at 167 beats per minute. CRL:  20.0 mm   10 w   6 d                  [place] EDC: 07/03/2022 Subchorionic hemorrhage: Small subchorionic hematoma measuring up to approximately 2.0 x 0.4 cm, similar to prior. Maternal uterus/adnexae: Slight interval decrease in anechoic cyst within the right ovary measuring up to 2.5 x 2.2 x 2.4 cm, previously 2.9 x 2.8 x 2.6 cm. No free fluid within the cul-de-sac. IMPRESSION: 1. Single live intrauterine pregnancy with crown-rump length corresponding  to 10 weeks 6 days estimated gestational age. This corresponds to appropriate interval growth from prior 11/23/2021 ultrasound. 2. Small subchorionic hematoma is similar to prior and within normal limits at this time. Consider attention on follow-up. Electronically Signed   By: Neita Garnet M.D.   On: 12/11/2021 12:28      Assessment and Plan:   Problem List Items Addressed This Visit    None Visit Diagnoses     SAB (spontaneous abortion)    -  Primary   Screening for cervical cancer       Relevant Orders   Cytology - PAP( [place])      Reassured patient that miscarriage is common with ~1/4 of women experiencing it in their lifetime. Reviewed most common reason is presumed to be genetic abnormalities that allow a pregnancy to start but not continue past an early stage, but realistically we do not know the cause in most cases. Reviewed that studies show no definite difference between attempting another pregnancy sooner vs waiting, though some studies do show better live birth outcomes with trying sooner.   Pap collected today as none on file.    Routine preventative health maintenance measures emphasized. Please refer to After Visit Summary for other counseling recommendations.   Return as needed.    Total face-to-face time with patient: 20 minutes.  Over 50% of encounter was spent on counseling and coordination of care.   Venora Maples, MD/MPH Attending Family Medicine Physician, [facility] for [facility], [facility]

## 2022-01-05 LAB — CYTOLOGY - PAP
Comment: NEGATIVE
Comment: NEGATIVE
Comment: NEGATIVE
Diagnosis: UNDETERMINED — AB
HPV 16: NEGATIVE
HPV 18 / 45: NEGATIVE
High risk HPV: POSITIVE — AB

## 2022-01-08 ENCOUNTER — Telehealth: Payer: Self-pay | Admitting: Family Medicine

## 2022-01-08 NOTE — Telephone Encounter (Signed)
Called patient to schedule colpo procedure, there was no answer to the phone call and the option to leave a voicemail was unavailable. A letter will be mailed out to the patient.

## 2022-01-26 ENCOUNTER — Encounter: Payer: 59 | Admitting: Family Medicine

## 2022-02-05 ENCOUNTER — Ambulatory Visit: Payer: 59

## 2022-02-05 ENCOUNTER — Other Ambulatory Visit: Payer: 59

## 2022-02-22 ENCOUNTER — Encounter: Payer: Self-pay | Admitting: Family Medicine

## 2022-02-22 ENCOUNTER — Ambulatory Visit (INDEPENDENT_AMBULATORY_CARE_PROVIDER_SITE_OTHER): Payer: 59 | Admitting: Family Medicine

## 2022-02-22 ENCOUNTER — Other Ambulatory Visit: Payer: Self-pay

## 2022-02-22 ENCOUNTER — Other Ambulatory Visit (HOSPITAL_COMMUNITY)
Admission: RE | Admit: 2022-02-22 | Discharge: 2022-02-22 | Disposition: A | Discharge status: Home / Self Care | Payer: 59 | Source: Ambulatory Visit | Attending: Family Medicine | Admitting: Family Medicine

## 2022-02-22 VITALS — BP 136/87 | HR 74 | Ht 64.0 in | Wt 167.1 lb

## 2022-02-22 DIAGNOSIS — Z1330 Encounter for screening examination for mental health and behavioral disorders, unspecified: Secondary | ICD-10-CM | POA: Diagnosis not present

## 2022-02-22 DIAGNOSIS — N870 Mild cervical dysplasia: Secondary | ICD-10-CM | POA: Diagnosis not present

## 2022-02-22 DIAGNOSIS — N871 Moderate cervical dysplasia: Secondary | ICD-10-CM

## 2022-02-22 DIAGNOSIS — Z3202 Encounter for pregnancy test, result negative: Secondary | ICD-10-CM | POA: Diagnosis not present

## 2022-02-22 DIAGNOSIS — Z8742 Personal history of other diseases of the female genital tract: Secondary | ICD-10-CM | POA: Insufficient documentation

## 2022-02-22 LAB — POCT PREGNANCY, URINE: Preg Test, Ur: NEGATIVE

## 2022-02-22 NOTE — Progress Notes (Signed)
    GYNECOLOGY CLINIC COLPOSCOPY PROCEDURE NOTE  37 y.o. 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  Pregnancy test: Lab Results  Component Value Date   PREGTESTUR NEGATIVE 02/22/2022    No Known Allergies  Patient given informed consent, signed copy in the chart, time out was performed.    Placed in lithotomy position. Cervix viewed with speculum and colposcope after application of acetic acid.   Colposcopy Adequacy Cervix fully visualized: Yes  SCJ fully visualized: No , unable to see inferior portion  Colposcopy Findings faint acetowhite lesion(s) noted at 11-1 and 6 o'clock  Corresponding biopsies were obtained.    ECC specimen was obtained.  All specimens were labeled and sent to pathology.  Hemostatic measures: Monsel's solution  Complications: none  Patient tolerated the procedure well.  OBGyn Exam  Colposcopy Impressions Low grade features  Plan Treatment plan pending biopsy results, per patient preference they will be communicated by telephone.  Patient was given post procedure instructions.  Will follow up pathology and manage accordingly; patient will be contacted with results and recommendations.  Routine preventative health maintenance measures emphasized.  Clarnce Flock, MD/MPH Attending Family Medicine Physician, [facility] for [facility], [place]

## 2022-02-26 LAB — SURGICAL PATHOLOGY

## 2022-02-27 ENCOUNTER — Telehealth: Payer: Self-pay

## 2022-02-27 ENCOUNTER — Encounter: Payer: Self-pay | Admitting: General Practice

## 2022-02-27 NOTE — Telephone Encounter (Signed)
Called patient and left VM. Will attempt to call at later time.

## 2022-02-27 NOTE — Telephone Encounter (Signed)
Per chart review, patient has read his message this morning. Will send follow up mychart message

## 2022-02-27 NOTE — Telephone Encounter (Signed)
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 Patient has option of active surveillance vs LEEP. Given this result, her age, and the fact that she is considering trying to get pregnant, I'd recommend active surveillance. Going to send a message to patient to say as much, please follow up by phone to see if she has received the message and what she would like to do.

## 2022-03-19 ENCOUNTER — Ambulatory Visit
Admission: EM | Admit: 2022-03-19 | Discharge: 2022-03-19 | Disposition: A | Discharge status: Home / Self Care | Payer: 59 | Attending: Internal Medicine | Admitting: Internal Medicine

## 2022-03-19 DIAGNOSIS — R350 Frequency of micturition: Secondary | ICD-10-CM | POA: Diagnosis not present

## 2022-03-19 DIAGNOSIS — N898 Other specified noninflammatory disorders of vagina: Secondary | ICD-10-CM | POA: Diagnosis not present

## 2022-03-19 LAB — POCT URINALYSIS DIP (MANUAL ENTRY)
Bilirubin, UA: NEGATIVE
Blood, UA: NEGATIVE
Glucose, UA: NEGATIVE mg/dL
Ketones, POC UA: NEGATIVE mg/dL
Leukocytes, UA: NEGATIVE
Nitrite, UA: NEGATIVE
Protein Ur, POC: NEGATIVE mg/dL
Spec Grav, UA: >=1.03 — AB (ref 1.010–1.025)
Urobilinogen, UA: 0.2 E.U./dL
pH, UA: 5 (ref 5.0–8.0)

## 2022-03-19 LAB — POCT URINE PREGNANCY: Preg Test, Ur: NEGATIVE

## 2022-03-19 NOTE — Discharge Instructions (Addendum)
Make sure you hydrate very well with plain water and a quantity of 80 ounces of water a day.  Please limit drinks that are considered urinary irritants such as soda, sweet tea, coffee, energy drinks, alcohol.  These can worsen your urinary and genital symptoms but also be the source of them.  I will let you know about your urine culture and vaginal swab results through MyChart to see if we need to prescribe or change your antibiotics based off of those results.

## 2022-03-19 NOTE — ED Triage Notes (Addendum)
Pt requesting preg test and c/o foul smelling light d/c-states she had miscarriage 12/2021-NAD-steady gait

## 2022-03-19 NOTE — ED Provider Notes (Signed)
[facility]  Note:  This document was prepared using Systems analyst and may include unintentional dictation errors.  MRN: [id number] DOB: 05-Feb-1986  Subjective:   Ana Taylor is a 37 y.o. female presenting for abnormal vaginal discharge. Had a pap smear then subsequent colposcopy 02/22/2022. Had a cycle for 7 days thereafter.  Has had 1 episode of bacterial vaginosis before.  Has had UTIs in the past and would like to be checked for this.  Reports urinary frequency.  Patient drinks a lot of coffee and also has been doing a lot of Sprite lately.  Normally does not do this.  She would like to have STI testing from the vaginal swab.  No current facility-administered medications for this encounter. No current outpatient medications on file.   No Known Allergies  Past Medical History:  Diagnosis Date   Hypercholesterolemia    PCOS (polycystic ovarian syndrome)      Past Surgical History:  Procedure Laterality Date   LEEP      Family History  Problem Relation Age of Onset   Arthritis Mother    Diabetes Mother    Heart failure Mother    Diabetes Father    Heart disease Father    Heart attack Father    Hypertension Father    Clotting disorder Father     Social History   Tobacco Use   Smoking status: Former   Smokeless tobacco: Never  Scientific laboratory technician Use: Never used  Substance Use Topics   Alcohol use: Not Currently    Comment: occ   Drug use: Never    ROS   Objective:   Vitals: BP (!) 146/92 (BP Location: Right Arm)   Pulse 63   Temp 99.1 F (37.3 C) (Oral)   Resp 20   LMP  (LMP Unknown)   SpO2 97%   Physical Exam Constitutional:      General: She is not in acute distress.    Appearance: Normal appearance. She is well-developed. She is not ill-appearing, toxic-appearing or diaphoretic.  HENT:     Head: Normocephalic and atraumatic.     Right Ear: External ear normal.     Left Ear: External  ear normal.     Nose: Nose normal.     Mouth/Throat:     Mouth: Mucous membranes are moist.  Eyes:     General: No scleral icterus.       Right eye: No discharge.        Left eye: No discharge.     Extraocular Movements: Extraocular movements intact.     Conjunctiva/sclera: Conjunctivae normal.  Cardiovascular:     Rate and Rhythm: Normal rate.  Pulmonary:     Effort: Pulmonary effort is normal.  Abdominal:     General: Bowel sounds are normal. There is no distension.     Palpations: Abdomen is soft. There is no mass.     Tenderness: There is no abdominal tenderness. There is no right CVA tenderness, left CVA tenderness, guarding or rebound.  Skin:    General: Skin is warm and dry.  Neurological:     General: No focal deficit present.     Mental Status: She is alert and oriented to person, place, and time.  Psychiatric:        Mood and Affect: Mood normal.        Behavior: Behavior normal.        Thought Content: Thought content normal.  Judgment: Judgment normal.     Results for orders placed or performed during the hospital encounter of 03/19/22 (from the past 24 hour(s))  POCT urine pregnancy     Status: None   Collection Time: 03/19/22  5:39 PM  Result Value Ref Range   Preg Test, Ur Negative Negative  POCT urinalysis dipstick     Status: Abnormal   Collection Time: 03/19/22  5:58 PM  Result Value Ref Range   Color, UA yellow yellow   Clarity, UA clear clear   Glucose, UA negative negative mg/dL   Bilirubin, UA negative negative   Ketones, POC UA negative negative mg/dL   Spec Grav, UA >=1.030 (A) 1.010 - 1.025   Blood, UA negative negative   pH, UA 5.0 5.0 - 8.0   Protein Ur, POC negative negative mg/dL   Urobilinogen, UA 0.2 0.2 or 1.0 E.U./dL   Nitrite, UA Negative Negative   Leukocytes, UA Negative Negative    Assessment and Plan :   PDMP not reviewed this encounter.  1. Vaginal discharge   2. Urinary frequency     Patient would prefer to  treat off of results.  Recommended avoiding urinary irritants and hydrating much more consistently. Labs pending. Will follow up with results.    Jaynee Eagles, [place] 03/19/22 1902

## 2022-03-20 LAB — CERVICOVAGINAL ANCILLARY ONLY
Bacterial Vaginitis (gardnerella): POSITIVE — AB
Candida Glabrata: NEGATIVE
Candida Vaginitis: NEGATIVE
Chlamydia: NEGATIVE
Comment: NEGATIVE
Comment: NEGATIVE
Comment: NEGATIVE
Comment: NEGATIVE
Comment: NEGATIVE
Comment: NORMAL
Neisseria Gonorrhea: NEGATIVE
Trichomonas: NEGATIVE

## 2022-03-21 ENCOUNTER — Telehealth (HOSPITAL_COMMUNITY): Payer: Self-pay | Admitting: Emergency Medicine

## 2022-03-21 MED ORDER — METRONIDAZOLE 500 MG PO TABS: 500.0000 mg | ORAL_TABLET | Freq: Two times a day (BID) | ORAL | 0 refills | Status: DC

## 2022-05-25 ENCOUNTER — Encounter (HOSPITAL_COMMUNITY): Payer: Self-pay | Admitting: *Deleted

## 2022-05-25 ENCOUNTER — Inpatient Hospital Stay (HOSPITAL_COMMUNITY): Payer: 59

## 2022-05-25 ENCOUNTER — Inpatient Hospital Stay (HOSPITAL_COMMUNITY)
Admission: AD | Admit: 2022-05-25 | Discharge: 2022-05-25 | Disposition: A | Discharge status: Home / Self Care | Payer: 59 | Attending: Obstetrics and Gynecology | Admitting: Obstetrics and Gynecology

## 2022-05-25 DIAGNOSIS — O209 Hemorrhage in early pregnancy, unspecified: Secondary | ICD-10-CM | POA: Diagnosis not present

## 2022-05-25 DIAGNOSIS — E282 Polycystic ovarian syndrome: Secondary | ICD-10-CM | POA: Insufficient documentation

## 2022-05-25 DIAGNOSIS — E7800 Pure hypercholesterolemia, unspecified: Secondary | ICD-10-CM | POA: Diagnosis not present

## 2022-05-25 DIAGNOSIS — O208 Other hemorrhage in early pregnancy: Secondary | ICD-10-CM | POA: Diagnosis not present

## 2022-05-25 DIAGNOSIS — O3680X0 Pregnancy with inconclusive fetal viability, not applicable or unspecified: Secondary | ICD-10-CM

## 2022-05-25 DIAGNOSIS — O09521 Supervision of elderly multigravida, first trimester: Secondary | ICD-10-CM | POA: Diagnosis not present

## 2022-05-25 DIAGNOSIS — O99281 Endocrine, nutritional and metabolic diseases complicating pregnancy, first trimester: Secondary | ICD-10-CM | POA: Insufficient documentation

## 2022-05-25 DIAGNOSIS — Z3A08 8 weeks gestation of pregnancy: Secondary | ICD-10-CM | POA: Diagnosis not present

## 2022-05-25 LAB — BASIC METABOLIC PANEL
Anion gap: 12 (ref 5–15)
BUN: 10 mg/dL (ref 6–20)
CO2: 18 mmol/L — ABNORMAL LOW (ref 22–32)
Calcium: 8.8 mg/dL — ABNORMAL LOW (ref 8.9–10.3)
Chloride: 104 mmol/L (ref 98–111)
Creatinine, Ser: 0.62 mg/dL (ref 0.44–1.00)
GFR, Estimated: >60 mL/min (ref >60)
Glucose, Bld: 99 mg/dL (ref 70–99)
Potassium: 3.9 mmol/L (ref 3.5–5.1)
Sodium: 134 mmol/L — ABNORMAL LOW (ref 135–145)

## 2022-05-25 LAB — CBC
HCT: 40.9 % (ref 36.0–46.0)
Hemoglobin: 14.1 g/dL (ref 12.0–15.0)
MCH: 29.1 pg (ref 26.0–34.0)
MCHC: 34.5 g/dL (ref 30.0–36.0)
MCV: 84.3 fL (ref 80.0–100.0)
Platelets: 293 10*3/uL (ref 150–400)
RBC: 4.85 MIL/uL (ref 3.87–5.11)
RDW: 15.4 % (ref 11.5–15.5)
WBC: 10.9 10*3/uL — ABNORMAL HIGH (ref 4.0–10.5)
nRBC: 0 % (ref 0.0–0.2)

## 2022-05-25 LAB — HCG, QUANTITATIVE, PREGNANCY: hCG, Beta Chain, Quant, S: >250000 m[IU]/mL — ABNORMAL HIGH (ref <5)

## 2022-05-25 NOTE — MAU Note (Signed)
.  Ana Taylor is a 37 y.o. at [redacted]w[redacted]d here in MAU reporting going to the BR about 0030 and had dark red bleeding. No pain. Has appt to start care on 4/17 at [facility] office LMP: 03/26/22 Onset of complaint: 0030 Pain score: 0 Vitals:   05/25/22 0111  BP: 130/82  Pulse: 78  Resp: 17  Temp: 98.6 F (37 C)  SpO2: 100%     FHT:n/a Lab orders placed from triage:   none

## 2022-05-25 NOTE — MAU Provider Note (Signed)
Chief Complaint: Vaginal Bleeding   Event Date/Time   First Provider Initiated Contact with Patient 05/25/22 0125        SUBJECTIVE HPI: Ana Taylor is a 37 y.o. G2P0010 at 4638w4d by LMP who presents to maternity admissions reporting vaginal bleeding tonight   No cramping.  History of SAB at 12 weeks in November. . She denies vaginal itching/burning, urinary symptoms, h/a, dizziness, n/v, or fever/chills.    Vaginal Bleeding The patient's primary symptoms include vaginal bleeding. The patient's pertinent negatives include no genital itching, genital odor or pelvic pain. This is a new problem. The current episode started today. The patient is experiencing no pain. She is pregnant. Pertinent negatives include no abdominal pain, back pain, chills, dysuria or fever. The vaginal discharge was bloody. The vaginal bleeding is lighter than menses. She has not been passing clots. She has not been passing tissue. Nothing aggravates the symptoms. She has tried nothing for the symptoms.    Past Medical History:  Diagnosis Date   Hypercholesterolemia    PCOS (polycystic ovarian syndrome)    Past Surgical History:  Procedure Laterality Date   LEEP     Social History   Socioeconomic History   Marital status: Married    Spouse name: Not on file   Number of children: Not on file   Years of education: Not on file   Highest education level: Not on file  Occupational History   Not on file  Tobacco Use   Smoking status: Former   Smokeless tobacco: Never  Vaping Use   Vaping Use: Never used  Substance and Sexual Activity   Alcohol use: Not Currently    Comment: occ   Drug use: Never   Sexual activity: Yes    Birth control/protection: None  Other Topics Concern   Not on file  Social History Narrative   Not on file   Social Determinants of Health   Financial Resource Strain: Not on file  Food Insecurity: No Food Insecurity (02/22/2022)   Hunger Vital Sign    Worried About  Running Out of Food in the Last Year: Never true    Ran Out of Food in the Last Year: Never true  Transportation Needs: No Transportation Needs (02/22/2022)   PRAPARE - Administrator, Civil ServiceTransportation    Lack of Transportation (Medical): No    Lack of Transportation (Non-Medical): No  Physical Activity: Not on file  Stress: Not on file  Social Connections: Not on file  Intimate Partner Violence: Not on file   No current facility-administered medications on file prior to encounter.   Current Outpatient Medications on File Prior to Encounter  Medication Sig Dispense Refill   metroNIDAZOLE (FLAGYL) 500 MG tablet Take 1 tablet (500 mg total) by mouth 2 (two) times daily. 14 tablet 0   No Known Allergies  I have reviewed patient's Past Medical Hx, Surgical Hx, Family Hx, Social Hx, medications and allergies.   ROS:  Review of Systems  Constitutional:  Negative for chills and fever.  Gastrointestinal:  Negative for abdominal pain.  Genitourinary:  Positive for vaginal bleeding. Negative for dysuria and pelvic pain.  Musculoskeletal:  Negative for back pain.   Review of Systems  Other systems negative   Physical Exam  Physical Exam Patient Vitals for the past 24 hrs:  BP Temp Pulse Resp SpO2 Height Weight  05/25/22 0111 130/82 98.6 F (37 C) 78 17 100 % 5\' 4"  (1.626 m) 77.1 kg   Constitutional: Well-developed, well-nourished female in no acute  distress.  Cardiovascular: normal rate Respiratory: normal effort GI: Abd soft, non-tender.  MS: Extremities nontender, no edema, normal ROM Neurologic: Alert and oriented x 4.  GU: Neg CVAT.  PELVIC EXAM: Moderate clotted blood in vagina  LAB RESULTS Results for orders placed or performed during the hospital encounter of 05/25/22 (from the past 24 hour(s))  CBC     Status: Abnormal   Collection Time: 05/25/22  1:36 AM  Result Value Ref Range   WBC 10.9 (H) 4.0 - 10.5 K/uL   RBC 4.85 3.87 - 5.11 MIL/uL   Hemoglobin 14.1 12.0 - 15.0 g/dL   HCT  06.0 15.6 - 15.3 %   MCV 84.3 80.0 - 100.0 fL   MCH 29.1 26.0 - 34.0 pg   MCHC 34.5 30.0 - 36.0 g/dL   RDW 79.4 32.7 - 61.4 %   Platelets 293 150 - 400 K/uL   nRBC 0.0 0.0 - 0.2 %  hCG, quantitative, pregnancy     Status: Abnormal   Collection Time: 05/25/22  1:36 AM  Result Value Ref Range   hCG, Beta Chain, Quant, S >250,000 (H) <5 mIU/mL  Basic metabolic panel     Status: Abnormal   Collection Time: 05/25/22  1:36 AM  Result Value Ref Range   Sodium 134 (L) 135 - 145 mmol/L   Potassium 3.9 3.5 - 5.1 mmol/L   Chloride 104 98 - 111 mmol/L   CO2 18 (L) 22 - 32 mmol/L   Glucose, Bld 99 70 - 99 mg/dL   BUN 10 6 - 20 mg/dL   Creatinine, Ser 7.09 0.44 - 1.00 mg/dL   Calcium 8.8 (L) 8.9 - 10.3 mg/dL   GFR, Estimated >29 >57 mL/min   Anion gap 12 5 - 15     --/--/B POS (10/05 0555)  IMAGING US OB Comp Less 14 Wks  Result Date: 05/25/2022 CLINICAL DATA:  Pregnant, vaginal bleeding, assigned gestational age [redacted] weeks, 4 days. EXAM: OBSTETRIC <14 WK ULTRASOUND TECHNIQUE: Transabdominal ultrasound was performed for evaluation of the gestation as well as the maternal uterus and adnexal regions. COMPARISON:  None Available. FINDINGS: Intrauterine gestational sac: Single Yolk sac:  Visualized. Embryo:  Visualized. Cardiac Activity: Visualized. Heart Rate: 164 bpm CRL:   17 mm   8 w 1 d                  [place] EDC: 01/03/2023 Subchorionic hemorrhage:  A small subchorionic hemorrhage is noted Maternal uterus/adnexae: The cervix is not well visualized via transabdominal technique. No intrauterine masses are seen. No free fluid within the cul-de-sac. The maternal ovaries are unremarkable. IMPRESSION: Single living intrauterine gestation with an estimated gestational age of [redacted] weeks, 1 day Small subchorionic hemorrhage Electronically Signed   By: Helyn Numbers M.D.   On: 05/25/2022 02:29     MAU Management/MDM: I have reviewed the triage vital signs and the nursing notes.   Pertinent labs & imaging  results that were available during my care of the patient were reviewed by me and considered in my medical decision making (see chart for details).      I have reviewed her medical records including past results, notes and treatments. Medical, Surgical, and family history were reviewed.  Medications and recent lab tests were reviewed  Ordered usual first trimester r/o ectopic labs.   Pelvic exam and cultures done Will check baseline Ultrasound to rule out ectopic.  This bleeding/pain can represent a normal pregnancy with bleeding, spontaneous abortion or even an  ectopic which can be life-threatening.  The process as listed above helps to determine which of these is present.  US showed a live single fetus in uterus FHR + Small subchorionic hemorrhage noted  ASSESSMENT Single IUP at 7660w4d Bleeding early pregnancy Pregnancy of unknown location Small subchorionic hemorrhage  PLAN Discharge home Pelvic rest Bleeding precautions Keep appt for new OB with Dr Crissie ReeseEckstat Pt stable at time of discharge. Encouraged to return here if she develops worsening of symptoms, increase in pain, fever, or other concerning symptoms.    Wynelle BourgeoisMarie Dudley Mages CNM, MSN Certified Nurse-Midwife 05/25/2022  1:25 AM

## 2022-05-25 NOTE — Discharge Instructions (Signed)

## 2022-05-25 NOTE — Progress Notes (Signed)
Wynelle Bourgeois CNM in to discuss u/s results. Written and verbal d/c instructions given by Joline Salt RN and pt voiced  understanding

## 2022-06-06 ENCOUNTER — Telehealth (INDEPENDENT_AMBULATORY_CARE_PROVIDER_SITE_OTHER): Payer: 59

## 2022-06-06 DIAGNOSIS — Z3689 Encounter for other specified antenatal screening: Secondary | ICD-10-CM

## 2022-06-06 DIAGNOSIS — O0990 Supervision of high risk pregnancy, unspecified, unspecified trimester: Secondary | ICD-10-CM

## 2022-06-06 DIAGNOSIS — O208 Other hemorrhage in early pregnancy: Secondary | ICD-10-CM

## 2022-06-06 NOTE — Progress Notes (Signed)
New OB Intake  I connected with Ana Taylor  on 06/06/22 at  8:15 AM EDT by MyChart Video Visit and verified that I am speaking with the correct person using two identifiers. Nurse is located at [facility] and pt is located at home.  I discussed the limitations, risks, security and privacy concerns of performing an evaluation and management service by telephone and the availability of in person appointments. I also discussed with the patient that there may be a patient responsible charge related to this service. The patient expressed understanding and agreed to proceed.  I explained I am completing New OB Intake today. We discussed EDD of 12/31/2022 that is based on LMP of 03/26/2022. Pt is G2/P0. I reviewed her allergies, medications, Medical/Surgical/OB history, and appropriate screenings. I informed her of [facility] services. [facility] information placed in AVS. Based on history, this is a high risk pregnancy.  Patient Active Problem List   Diagnosis Date Noted   Subchorionic hemorrhage in first trimester 05/25/2022   PCOS (polycystic ovarian syndrome) 12/20/2021   History of abnormal cervical Pap smear 12/20/2021   AMA (advanced maternal age) primigravida 35+, first trimester 12/20/2021    Concerns addressed today  Delivery Plans Plans to deliver at [facility]. Patient given information for [facility] Healthy Baby website for more information about [facility].   MyChart/Babyscripts MyChart access verified. I explained pt will have some visits in office and some virtually. Babyscripts instructions given and order placed. Patient verifies receipt of registration text/e-mail. Account successfully created and app downloaded.  Blood Pressure Cuff/Weight Scale Patient has private insurance; instructed to purchase blood pressure cuff and bring to first prenatal appt. Explained after first prenatal appt pt will check weekly and document in Babyscripts.   Anatomy US Explained first  scheduled [place] will be around 19 weeks. Anatomy US scheduled for 08/07/2022 at 9:00am. Pt notified to arrive at 8:45am.  Labs Discussed Natera genetic screening with patient. Would like both Panorama and Horizon drawn at new OB visit. Routine prenatal labs needed.  COVID Vaccine Patient has not had COVID vaccine.   Is patient a CenteringPregnancy candidate?  Declined due to Enrolled in [facility]   Is patient a Mom+Baby Combined Care candidate?  Accepted   If accepted, Mom+Baby staff notified  Social Determinants of Health Food Insecurity: Patient denies food insecurity. WIC Referral: Patient is interested in referral to [facility].  Transportation: Patient denies transportation needs. Childcare: Discussed no children allowed at ultrasound appointments. Offered childcare services; patient declines childcare services at this time.  Interested in [place]? If yes, send referral and doula dot phrase.   First visit review I reviewed new OB appt with patient. I explained they will have a provider visit that includes new ob labs and listening to baby heartbeat. Explained pt will be seen by Dr. Crissie Reese at first visit; encounter routed to appropriate provider. Explained that patient will be seen by pregnancy navigator following visit with provider.   Lowry Bowl, CMA 06/06/2022  8:39 AM

## 2022-06-06 NOTE — Patient Instructions (Signed)

## 2022-06-07 ENCOUNTER — Inpatient Hospital Stay (HOSPITAL_COMMUNITY)
Admission: AD | Admit: 2022-06-07 | Discharge: 2022-06-07 | Disposition: A | Discharge status: Home / Self Care | Payer: 59 | Attending: Obstetrics and Gynecology | Admitting: Obstetrics and Gynecology

## 2022-06-07 ENCOUNTER — Other Ambulatory Visit: Payer: Self-pay

## 2022-06-07 ENCOUNTER — Inpatient Hospital Stay (HOSPITAL_COMMUNITY): Payer: 59

## 2022-06-07 ENCOUNTER — Encounter (HOSPITAL_COMMUNITY): Payer: Self-pay | Admitting: Obstetrics and Gynecology

## 2022-06-07 DIAGNOSIS — O209 Hemorrhage in early pregnancy, unspecified: Secondary | ICD-10-CM | POA: Diagnosis not present

## 2022-06-07 DIAGNOSIS — O26891 Other specified pregnancy related conditions, first trimester: Secondary | ICD-10-CM | POA: Diagnosis not present

## 2022-06-07 DIAGNOSIS — O208 Other hemorrhage in early pregnancy: Secondary | ICD-10-CM | POA: Diagnosis present

## 2022-06-07 DIAGNOSIS — Z3A10 10 weeks gestation of pregnancy: Secondary | ICD-10-CM | POA: Insufficient documentation

## 2022-06-07 DIAGNOSIS — O0990 Supervision of high risk pregnancy, unspecified, unspecified trimester: Secondary | ICD-10-CM

## 2022-06-07 LAB — WET PREP, GENITAL
Clue Cells Wet Prep HPF POC: NONE SEEN
Sperm: NONE SEEN
Trich, Wet Prep: NONE SEEN
WBC, Wet Prep HPF POC: <10 (ref <10)
Yeast Wet Prep HPF POC: NONE SEEN

## 2022-06-07 NOTE — MAU Provider Note (Addendum)
History     CSN: [id number]  Arrival date and time: 06/07/22 1322   Event Date/Time   First Provider Initiated Contact with Patient 06/07/22 1437      Chief Complaint  Patient presents with   Vaginal Bleeding   Abdominal Pain   Ana Taylor is a 37 y.o. F with G2P0010 at [redacted]w[redacted]d who presents for vaginal bleeding that occurred around 1pm this afternoon. Per pt, she was at work eating lunch and as she stood up she noticed that she had bleed down her legs and soaked through her pants. She has had some passage of clots but not overt tissue. This was painless at the time but she notes that she began experiencing some pain in the right and left inguinal area shortly after. She also reports that she has had a mild headache throughout the day which worsened with bleeding, but has since started to subside. Of note, she was seen in the MAU on 04/05 with a similar episode of bleeding. At that time, workup revealed a small subchorionic hemorrhage but was otherwise unremarkable. She also had a previous pregnancy loss in October which she states was around the time of this current pregnancy; she notes that her current bleeding does not feel similar to this current episode and she was in much more pain at that time. She denies any nausea, vomiting, vaginal discharge. No recent trauma.     Vaginal Bleeding The patient's pertinent negatives include no vaginal discharge. Associated symptoms include abdominal pain (L and R inguinal areas) and headaches. Pertinent negatives include no fever, nausea or vomiting.  Abdominal Pain Associated symptoms include headaches. Pertinent negatives include no fever, nausea or vomiting.   OB History     Gravida  2   Para      Term      Preterm      AB  1   Living  0      SAB  1   IAB      Ectopic      Multiple      Live Births              Past Medical History:  Diagnosis Date   Hypercholesterolemia    PCOS (polycystic ovarian  syndrome)     Past Surgical History:  Procedure Laterality Date   LEEP      Family History  Problem Relation Age of Onset   Arthritis Mother    Diabetes Mother    Heart failure Mother    Diabetes Father    Heart disease Father    Heart attack Father    Hypertension Father    Clotting disorder Father     Social History   Tobacco Use   Smoking status: Former   Smokeless tobacco: Never  Building services engineer Use: Never used  Substance Use Topics   Alcohol use: Not Currently    Comment: occ   Drug use: Never    Allergies: No Known Allergies  Medications Prior to Admission  Medication Sig Dispense Refill Last Dose   loratadine (CLARITIN REDITABS) 10 MG dissolvable tablet Take 10 mg by mouth daily.   06/07/2022   Prenatal Vit-Fe Fumarate-FA (PRENATAL MULTIVITAMIN) TABS tablet Take 1 tablet by mouth daily at 12 noon.   06/06/2022   ibuprofen (ADVIL) 200 MG tablet Take 1 tablet every 6 hours by oral route. (Patient not taking: Reported on 06/06/2022)       Review of Systems  Constitutional:  Negative  for fever.  Gastrointestinal:  Positive for abdominal pain (L and R inguinal areas). Negative for nausea and vomiting.  Genitourinary:  Positive for vaginal bleeding. Negative for vaginal discharge.  Neurological:  Positive for headaches.  All other systems reviewed and are negative.  Physical Exam   Blood pressure 132/79, pulse 81, temperature 98.1 F (36.7 C), temperature source Oral, resp. rate 18, height  (1.626 m), weight 79.5 kg, last menstrual period 03/26/2022, SpO2 97 %.  Physical Exam Vitals and nursing note reviewed. Exam conducted with a chaperone present.  Constitutional:      Appearance: She is well-developed.  HENT:     Head: Normocephalic and atraumatic.  Cardiovascular:     Rate and Rhythm: Normal rate.  Pulmonary:     Effort: Pulmonary effort is normal. No respiratory distress.  Abdominal:     Palpations: Abdomen is soft.     Tenderness: There  is abdominal tenderness (mild TTP over the L&R inguinal area). There is no guarding or rebound.     Comments: Gravid abdomen.   Genitourinary:    Vagina: Bleeding present. No vaginal discharge.     Uterus: Enlarged. Not tender.      Comments: Bleeding noted from within internal os. No cervicovaginal lesions identified.  Skin:    General: Skin is warm and dry.     Capillary Refill: Capillary refill takes less than 2 seconds.     Findings: No rash.  Neurological:     Mental Status: She is alert.     Comments: No gross deficit.   Psychiatric:        Mood and Affect: Mood normal.        Behavior: Behavior normal.    MAU Course  Procedures  MDM Pt is a 36yoF G2P0010 at [redacted]w[redacted]d presenting for vaginal bleeding iso known subchorionic hemorrhage by [place] in early April and around the time of prior pregnancy loss. FHT found and appropriately in the low 160s. Pt's vitals reassuring as well. Cervical exam with bleeding from internal os and without cervicovaginal lesions noted. Wet prep obtained which was unremarkable. Will obtain workup including [place] to evaluate further.   Assessment and Plan   #Subchorionic hemorrhage  #Vaginal bleeding  - Given results of cervical exam, her current bleeding likely represents threatened abortion given history of subchorionic hemorrhage vs bleeding of first trimester.   4:53 PM [place] with live intrauterine pregnancy without evidence of subchorionic hemorrhage as previously visualized in early April. Most concerning for threatened miscarriage given bleeding from uterus on cervical exam. Discussed this with patient. Will discharge home with return precautions given, including increased bleeding, passage of tissue, pelvic pain.   Rosario Adie, MS3 [facility]  06/07/22 4:53 PM   Fellow Attestation  I saw and evaluated the patient, performing the key elements of the service.I  personally performed or re-performed the history, physical exam, and  medical decision making activities of this service and have verified that the service and findings are accurately documented in the student's note. I developed the management plan that is described in the medical student's note, and I agree with the content, with my edits above.    Derrel Nip, MD OB Fellow   Mildly elevated BP noted at time of D/C. Pt denies Hx of HTN. Will order home cuff and have her bring log to NOB. May need meds.   [place], [place] 06/07/2022 7:18 PM

## 2022-06-07 NOTE — Discharge Instructions (Addendum)
You were evaluated today in the Maternity Assessment Unit (MAU) for vaginal bleeding within the first trimester. Your ultrasound did not show a cause for this bleeding; however, bleeding from the uterus was noted during your cervical exam today. We do not have a clear underlying cause for your bleeding but this is concerning for what is known as a "threatened miscarriage." Return precautions were discussed with you, including increased vaginal bleeding, passage of tissue, or worsening pain in your lower abdomen. Please return for reevaluation in the MAU if you begin to experience these symptoms. Otherwise, please follow up with your routine prenatal care provider. It was a pleasure to meet you today and we hope you have a wonderful day!

## 2022-06-07 NOTE — MAU Note (Signed)
Ana Taylor is a 37 y.o. at [redacted]w[redacted]d here in MAU reporting: intermittent pain in lower abdomen on both the right and left sides that began this afternoon.  Also reports having dark red VB that began this afternoon after 1300.  Reports bled through onto clothing and blood ran down her leg.  States does have a Hx of subchorionic hemorrhage with the pregnancy.  Reports also has a HA, started when VB began but it's subsiding. LMP: NA Onset of complaint: today Pain score: 5 Vitals:   06/07/22 1357  BP: 126/82  Pulse: 93  Resp: 18  Temp: 98.1 F (36.7 C)  SpO2: 97%     FHT: deferred d/t maternal apparel Lab orders placed from triage:  [place]

## 2022-06-12 ENCOUNTER — Other Ambulatory Visit: Payer: Self-pay | Admitting: Lactation Services

## 2022-06-12 MED ORDER — BLOOD PRESSURE KIT DEVI: 1.0000 | Freq: Once | 0 refills | Status: AC

## 2022-06-14 ENCOUNTER — Encounter: Payer: 59 | Admitting: Family Medicine

## 2022-06-19 ENCOUNTER — Encounter: Payer: Self-pay | Admitting: Family Medicine

## 2022-06-19 ENCOUNTER — Other Ambulatory Visit: Payer: Self-pay

## 2022-06-19 ENCOUNTER — Ambulatory Visit (INDEPENDENT_AMBULATORY_CARE_PROVIDER_SITE_OTHER): Payer: 59 | Admitting: Family Medicine

## 2022-06-19 ENCOUNTER — Other Ambulatory Visit (HOSPITAL_COMMUNITY)
Admission: RE | Admit: 2022-06-19 | Discharge: 2022-06-19 | Disposition: A | Discharge status: Home / Self Care | Payer: 59 | Source: Ambulatory Visit | Attending: Family Medicine | Admitting: Family Medicine

## 2022-06-19 VITALS — BP 136/90 | HR 102 | Wt 178.4 lb

## 2022-06-19 DIAGNOSIS — O10911 Unspecified pre-existing hypertension complicating pregnancy, first trimester: Secondary | ICD-10-CM

## 2022-06-19 DIAGNOSIS — O09511 Supervision of elderly primigravida, first trimester: Secondary | ICD-10-CM | POA: Diagnosis not present

## 2022-06-19 DIAGNOSIS — O0990 Supervision of high risk pregnancy, unspecified, unspecified trimester: Secondary | ICD-10-CM | POA: Insufficient documentation

## 2022-06-19 DIAGNOSIS — O0991 Supervision of high risk pregnancy, unspecified, first trimester: Secondary | ICD-10-CM

## 2022-06-19 DIAGNOSIS — O208 Other hemorrhage in early pregnancy: Secondary | ICD-10-CM

## 2022-06-19 DIAGNOSIS — Z8742 Personal history of other diseases of the female genital tract: Secondary | ICD-10-CM

## 2022-06-19 DIAGNOSIS — Z3A12 12 weeks gestation of pregnancy: Secondary | ICD-10-CM

## 2022-06-19 DIAGNOSIS — R7303 Prediabetes: Secondary | ICD-10-CM

## 2022-06-19 DIAGNOSIS — O10919 Unspecified pre-existing hypertension complicating pregnancy, unspecified trimester: Secondary | ICD-10-CM

## 2022-06-19 MED ORDER — ASPIRIN 81 MG PO TBEC
162.0000 mg | DELAYED_RELEASE_TABLET | Freq: Every day | ORAL | 0 refills | Status: DC
Start: 2022-06-19 — End: 2022-07-23

## 2022-06-19 MED ORDER — NIFEDIPINE ER OSMOTIC RELEASE 30 MG PO TB24
30.0000 mg | ORAL_TABLET | Freq: Every day | ORAL | 5 refills | Status: AC
Start: 2022-06-19 — End: ?

## 2022-06-19 NOTE — Patient Instructions (Signed)
Second Trimester of Pregnancy  The second trimester of pregnancy is from week 13 through week 27. This is months 4 through 6 of pregnancy. The second trimester is often a time when you feel your best. Your body has adjusted to being pregnant, and you begin to feel better physically. During the second trimester: Morning sickness has lessened or stopped completely. You may have more energy. You may have an increase in appetite. The second trimester is also a time when the unborn baby (fetus) is growing rapidly. At the end of the sixth month, the fetus may be up to 12 inches long and weigh about 1 pounds. You will likely begin to feel the baby move (quickening) between 16 and 20 weeks of pregnancy. Body changes during your second trimester Your body continues to go through many changes during your second trimester. The changes vary and generally return to normal after the baby is born. Physical changes Your weight will continue to increase. You will notice your lower abdomen bulging out. You may begin to get stretch marks on your hips, abdomen, and breasts. Your breasts will continue to grow and to become tender. Dark spots or blotches (chloasma or mask of pregnancy) may develop on your face. A dark line from your belly button to the pubic area (linea nigra) may appear. You may have changes in your hair. These can include thickening of your hair, rapid growth, and changes in texture. Some people also have hair loss during or after pregnancy, or hair that feels dry or thin. Health changes You may develop headaches. You may have heartburn. You may develop constipation. You may develop hemorrhoids or swollen, bulging veins (varicose veins). Your gums may bleed and may be sensitive to brushing and flossing. You may urinate more often because the fetus is pressing on your bladder. You may have back pain. This is caused by: Weight gain. Pregnancy hormones that are relaxing the joints in your  pelvis. A shift in weight and the muscles that support your balance. Follow these instructions at home: Medicines Follow your health care provider's instructions regarding medicine use. Specific medicines may be either safe or unsafe to take during pregnancy. Do not take any medicines unless approved by your health care provider. Take a prenatal vitamin that contains at least 600 micrograms (mcg) of folic acid. Eating and drinking Eat a healthy diet that includes fresh fruits and vegetables, whole grains, good sources of protein such as meat, eggs, or tofu, and low-fat dairy products. Avoid raw meat and unpasteurized juice, milk, and cheese. These carry germs that can harm you and your baby. You may need to take these actions to prevent or treat constipation: Drink enough fluid to keep your urine pale yellow. Eat foods that are high in fiber, such as beans, whole grains, and fresh fruits and vegetables. Limit foods that are high in fat and processed sugars, such as fried or sweet foods. Activity Exercise only as directed by your health care provider. Most people can continue their usual exercise routine during pregnancy. Try to exercise for 30 minutes at least 5 days a week. Stop exercising if you develop contractions in your uterus. Stop exercising if you develop pain or cramping in the lower abdomen or lower back. Avoid exercising if it is very hot or humid or if you are at a high altitude. Avoid heavy lifting. If you choose to, you may have sex unless your health care provider tells you not to. Relieving pain and discomfort Wear a supportive   bra to prevent discomfort from breast tenderness. Take warm sitz baths to soothe any pain or discomfort caused by hemorrhoids. Use hemorrhoid cream if your health care provider approves. Rest with your legs raised (elevated) if you have leg cramps or low back pain. If you develop varicose veins: Wear support hose as told by your health care  provider. Elevate your feet for 15 minutes, 3-4 times a day. Limit salt in your diet. Safety Wear your seat belt at all times when driving or riding in a car. Talk with your health care provider if someone is verbally or physically abusive to you. Lifestyle Do not use hot tubs, steam rooms, or saunas. Do not douche. Do not use tampons or scented sanitary pads. Avoid cat litter boxes and soil used by cats. These carry germs that can cause birth defects in the baby and possibly loss of the fetus by miscarriage or stillbirth. Do not use herbal remedies, alcohol, illegal drugs, or medicines that are not approved by your health care provider. Chemicals in these products can harm your baby. Do not use any products that contain nicotine or tobacco, such as cigarettes, e-cigarettes, and chewing tobacco. If you need help quitting, ask your health care provider. General instructions During a routine prenatal visit, your health care provider will do a physical exam and other tests. He or she will also discuss your overall health. Keep all follow-up visits. This is important. Ask your health care provider for a referral to a local prenatal education class. Ask for help if you have counseling or nutritional needs during pregnancy. Your health care provider can offer advice or refer you to specialists for help with various needs. Where to find more information American Pregnancy Association: americanpregnancy.org American College of Obstetricians and Gynecologists: acog.org/en/Womens%20Health/Pregnancy Office on Women's Health: womenshealth.gov/pregnancy Contact a health care provider if you have: A headache that does not go away when you take medicine. Vision changes or you see spots in front of your eyes. Mild pelvic cramps, pelvic pressure, or nagging pain in the abdominal area. Persistent nausea, vomiting, or diarrhea. A bad-smelling vaginal discharge or foul-smelling urine. Pain when you  urinate. Sudden or extreme swelling of your face, hands, ankles, feet, or legs. A fever. Get help right away if you: Have fluid leaking from your vagina. Have spotting or bleeding from your vagina. Have severe abdominal cramping or pain. Have difficulty breathing. Have chest pain. Have fainting spells. Have not felt your baby move for the time period told by your health care provider. Have new or increased pain, swelling, or redness in an arm or leg. Summary The second trimester of pregnancy is from week 13 through week 27 (months 4 through 6). Do not use herbal remedies, alcohol, illegal drugs, or medicines that are not approved by your health care provider. Chemicals in these products can harm your baby. Exercise only as directed by your health care provider. Most people can continue their usual exercise routine during pregnancy. Keep all follow-up visits. This is important. This information is not intended to replace advice given to you by your health care provider. Make sure you discuss any questions you have with your health care provider. Document Revised: 07/15/2019 Document Reviewed: 05/21/2019 Elsevier Patient Education  2023 Elsevier Inc.  Contraception Choices Contraception, also called birth control, refers to methods or devices that prevent pregnancy. Hormonal methods  Contraceptive implant A contraceptive implant is a thin, plastic tube that contains a hormone that prevents pregnancy. It is different from an intrauterine device (  IUD). It is inserted into the upper part of the arm by a health care provider. Implants can be effective for up to 3 years. Progestin-only injections Progestin-only injections are injections of progestin, a synthetic form of the hormone progesterone. They are given every 3 months by a health care provider. Birth control pills Birth control pills are pills that contain hormones that prevent pregnancy. They must be taken once a day, preferably at  the same time each day. A prescription is needed to use this method of contraception. Birth control patch The birth control patch contains hormones that prevent pregnancy. It is placed on the skin and must be changed once a week for three weeks and removed on the fourth week. A prescription is needed to use this method of contraception. Vaginal ring A vaginal ring contains hormones that prevent pregnancy. It is placed in the vagina for three weeks and removed on the fourth week. After that, the process is repeated with a new ring. A prescription is needed to use this method of contraception. Emergency contraceptive Emergency contraceptives prevent pregnancy after unprotected sex. They come in pill form and can be taken up to 5 days after sex. They work best the sooner they are taken after having sex. Most emergency contraceptives are available without a prescription. This method should not be used as your only form of birth control. Barrier methods  Female condom A female condom is a thin sheath that is worn over the penis during sex. Condoms keep sperm from going inside a woman's body. They can be used with a sperm-killing substance (spermicide) to increase their effectiveness. They should be thrown away after one use. Female condom A female condom is a soft, loose-fitting sheath that is put into the vagina before sex. The condom keeps sperm from going inside a woman's body. They should be thrown away after one use. Diaphragm A diaphragm is a soft, dome-shaped barrier. It is inserted into the vagina before sex, along with a spermicide. The diaphragm blocks sperm from entering the uterus, and the spermicide kills sperm. A diaphragm should be left in the vagina for 6-8 hours after sex and removed within 24 hours. A diaphragm is prescribed and fitted by a health care provider. A diaphragm should be replaced every 1-2 years, after giving birth, after gaining more than 15 lb (6.8 kg), and after pelvic  surgery. Cervical cap A cervical cap is a round, soft latex or plastic cup that fits over the cervix. It is inserted into the vagina before sex, along with spermicide. It blocks sperm from entering the uterus. The cap should be left in place for 6-8 hours after sex and removed within 48 hours. A cervical cap must be prescribed and fitted by a health care provider. It should be replaced every 2 years. Sponge A sponge is a soft, circular piece of polyurethane foam with spermicide in it. The sponge helps block sperm from entering the uterus, and the spermicide kills sperm. To use it, you make it wet and then insert it into the vagina. It should be inserted before sex, left in for at least 6 hours after sex, and removed and thrown away within 30 hours. Spermicides Spermicides are chemicals that kill or block sperm from entering the cervix and uterus. They can come as a cream, jelly, suppository, foam, or tablet. A spermicide should be inserted into the vagina with an applicator at least 10-15 minutes before sex to allow time for it to work. The process must be   repeated every time you have sex. Spermicides do not require a prescription. Intrauterine contraception Intrauterine device (IUD) An IUD is a T-shaped device that is put in a woman's uterus. There are two types: Hormone IUD.This type contains progestin, a synthetic form of the hormone progesterone. This type can stay in place for 3-5 years. Copper IUD.This type is wrapped in copper wire. It can stay in place for 10 years. Permanent methods of contraception Female tubal ligation In this method, a woman's fallopian tubes are sealed, tied, or blocked during surgery to prevent eggs from traveling to the uterus. Hysteroscopic sterilization In this method, a small, flexible insert is placed into each fallopian tube. The inserts cause scar tissue to form in the fallopian tubes and block them, so sperm cannot reach an egg. The procedure takes about 3  months to be effective. Another form of birth control must be used during those 3 months. Female sterilization This is a procedure to tie off the tubes that carry sperm (vasectomy). After the procedure, the man can still ejaculate fluid (semen). Another form of birth control must be used for 3 months after the procedure. Natural planning methods Natural family planning In this method, a couple does not have sex on days when the woman could become pregnant. Calendar method In this method, the woman keeps track of the length of each menstrual cycle, identifies the days when pregnancy can happen, and does not have sex on those days. Ovulation method In this method, a couple avoids sex during ovulation. Symptothermal method This method involves not having sex during ovulation. The woman typically checks for ovulation by watching changes in her temperature and in the consistency of cervical mucus. Post-ovulation method In this method, a couple waits to have sex until after ovulation. Where to find more information Centers for Disease Control and Prevention: www.cdc.gov Summary Contraception, also called birth control, refers to methods or devices that prevent pregnancy. Hormonal methods of contraception include implants, injections, pills, patches, vaginal rings, and emergency contraceptives. Barrier methods of contraception can include female condoms, female condoms, diaphragms, cervical caps, sponges, and spermicides. There are two types of IUDs (intrauterine devices). An IUD can be put in a woman's uterus to prevent pregnancy for 3-5 years. Permanent sterilization can be done through a procedure for males and females. Natural family planning methods involve nothaving sex on days when the woman could become pregnant. This information is not intended to replace advice given to you by your health care provider. Make sure you discuss any questions you have with your health care provider. Document  Revised: 07/13/2019 Document Reviewed: 07/13/2019 Elsevier Patient Education  2023 Elsevier Inc.  

## 2022-06-19 NOTE — Progress Notes (Signed)
Subjective:   Ana Taylor is a 37 y.o. G2P0010 at [redacted]w[redacted]d by early ultrasound being seen today for her first obstetrical visit.  Her obstetrical history is significant for advanced maternal age and hypertension . Patient does intend to breast feed. Pregnancy history fully reviewed.  Patient reports ongoing [facility] bleeding, usually not daily just with exercise or physical exertion.  HISTORY: OB History  Gravida Para Term Preterm AB Living  2 0 0 0 1 0  SAB IAB Ectopic Multiple Live Births  1 0 0 0 0    # Outcome Date GA Lbr Len/2nd Weight Sex Delivery Anes PTL Lv  2 Current           1 SAB              Last pap smear: Lab Results  Component Value Date   DIAGPAP (A) 01/03/2022    - Atypical squamous cells of undetermined significance (ASC-US)   HPVHIGH Positive (A) 01/03/2022    Past Medical History:  Diagnosis Date   Hypercholesterolemia    PCOS (polycystic ovarian syndrome)    Past Surgical History:  Procedure Laterality Date   LEEP     Family History  Problem Relation Age of Onset   Arthritis Mother    Diabetes Mother    Heart failure Mother    Diabetes Father    Heart disease Father    Heart attack Father    Hypertension Father    Clotting disorder Father    Social History   Tobacco Use   Smoking status: Former   Smokeless tobacco: Never  Building services engineer Use: Never used  Substance Use Topics   Alcohol use: Not Currently    Comment: occ   Drug use: Never   No Known Allergies Current Outpatient Medications on File Prior to Visit  Medication Sig Dispense Refill   loratadine (CLARITIN REDITABS) 10 MG dissolvable tablet Take 10 mg by mouth daily.     Prenatal Vit-Fe Fumarate-FA (PRENATAL MULTIVITAMIN) TABS tablet Take 1 tablet by mouth daily at 12 noon.     No current facility-administered medications on file prior to visit.     Exam   Vitals:   06/19/22 1430 06/19/22 1444  BP: (!) 164/75 (!) 136/90  Pulse: (!) 118 (!) 102   Weight: 178 lb 6.4 oz (80.9 kg)    Fetal Heart Rate (bpm): 164  Uterus:     Pelvic Exam: Perineum: no hemorrhoids, normal perineum   Vulva: normal external genitalia, no lesions   Vagina:  normal mucosa mild amount of white discharge but denies any symptoms   Cervix: no lesions and normal, pap smear done.   System: General: well-developed, well-nourished female in no acute distress   Skin: normal coloration and turgor, no rashes   Neurologic: oriented, normal, negative, normal mood   Extremities: normal strength, tone, and muscle mass, ROM of all joints is normal   HEENT PERRLA, extraocular movement intact and sclera clear, anicteric   Neck supple and no masses   Respiratory:  no respiratory distress      Assessment:   Pregnancy: G2P0010 Patient Active Problem List   Diagnosis Date Noted   Chronic hypertension affecting pregnancy 06/19/2022   Supervision of high risk pregnancy, antepartum 06/06/2022   Subchorionic hemorrhage in first trimester 05/25/2022   PCOS (polycystic ovarian syndrome) 12/20/2021   History of abnormal cervical Pap smear 12/20/2021   AMA (advanced maternal age) primigravida 35+, first trimester 12/20/2021  Plan:  1. Supervision of high risk pregnancy, antepartum Bp elevated, see below FHR normal Initial labs drawn. Continue prenatal vitamins. Genetic Screening discussed, NIPS: ordered. Ultrasound discussed; fetal anatomic survey: ordered. Problem list reviewed and updated. The nature of [facility] clinic was explained to patient; Voiced they may need to be seen by other [facility] providers which includes family medicine physicians, OB GYNs, and APPs. Delivery will hopefully be with one of the [facility] providers or another [facility] Medicine physician and we cannot promise this at this time.  Discussed there are [facility] staff in the hospital 24-7 and they understand and support this model and there is a likelihood one of these providers will catch their baby.   We also discussed that the service includes learners (residents, student) and they will be involved in the care team.   2. Chronic hypertension affecting pregnancy Multiple elevated pressures today as well as at last MAU visit c/w cHTN Baseline labs today Discussed initiating ASA 182 mg qhs, reviewed literature showing superior efficacy at this dose, she is amenable Will also start nifedipine 30 XL, reviewed CHAPS trial and importance of tight BP control for improved outcomes  3. Subchorionic hemorrhage in first trimester Ongoing bleeding but normal FHR, discussed this may persist for some time but no known interventions  4. AMA (advanced maternal age) primigravida 35+, first trimester On asa  5. History of abnormal cervical Pap smear S/p CIN 1-2 colpo 02/2022, plan for repeat pap and colpo after delivery   Routine obstetric precautions reviewed. Return in 4 weeks (on 07/17/2022) for [facility] patient, ob visit.

## 2022-06-20 ENCOUNTER — Telehealth: Payer: Self-pay

## 2022-06-20 ENCOUNTER — Encounter: Payer: Self-pay | Admitting: Family Medicine

## 2022-06-20 DIAGNOSIS — R7303 Prediabetes: Secondary | ICD-10-CM | POA: Insufficient documentation

## 2022-06-20 LAB — CBC/D/PLT+RPR+RH+ABO+RUBIGG...
Antibody Screen: NEGATIVE
Basophils Absolute: 0.1 10*3/uL (ref 0.0–0.2)
Basos: 1 %
EOS (ABSOLUTE): 0.1 10*3/uL (ref 0.0–0.4)
Eos: 0 %
HCV Ab: NONREACTIVE
HIV Screen 4th Generation wRfx: NONREACTIVE
Hematocrit: 43 % (ref 34.0–46.6)
Hemoglobin: 14.3 g/dL (ref 11.1–15.9)
Hepatitis B Surface Ag: NEGATIVE
Immature Grans (Abs): 0.2 10*3/uL — ABNORMAL HIGH (ref 0.0–0.1)
Immature Granulocytes: 1 %
Lymphocytes Absolute: 2 10*3/uL (ref 0.7–3.1)
Lymphs: 15 %
MCH: 28.9 pg (ref 26.6–33.0)
MCHC: 33.3 g/dL (ref 31.5–35.7)
MCV: 87 fL (ref 79–97)
Monocytes Absolute: 0.7 10*3/uL (ref 0.1–0.9)
Monocytes: 6 %
Neutrophils Absolute: 10.1 10*3/uL — ABNORMAL HIGH (ref 1.4–7.0)
Neutrophils: 77 %
Platelets: 354 10*3/uL (ref 150–450)
RBC: 4.94 x10E6/uL (ref 3.77–5.28)
RDW: 14.9 % (ref 11.7–15.4)
RPR Ser Ql: NONREACTIVE
Rh Factor: POSITIVE
Rubella Antibodies, IGG: 6.63 index (ref >0.99)
WBC: 13.1 10*3/uL — ABNORMAL HIGH (ref 3.4–10.8)

## 2022-06-20 LAB — HEMOGLOBIN A1C
Est. average glucose Bld gHb Est-mCnc: 126 mg/dL
Hgb A1c MFr Bld: 6 % — ABNORMAL HIGH (ref 4.8–5.6)

## 2022-06-20 LAB — COMPREHENSIVE METABOLIC PANEL
ALT: 15 IU/L (ref 0–32)
AST: 15 IU/L (ref 0–40)
Albumin/Globulin Ratio: 1.4 (ref 1.2–2.2)
Albumin: 4 g/dL (ref 3.9–4.9)
Alkaline Phosphatase: 48 IU/L (ref 44–121)
BUN/Creatinine Ratio: 27 — ABNORMAL HIGH (ref 9–23)
BUN: 16 mg/dL (ref 6–20)
Bilirubin Total: 0.3 mg/dL (ref 0.0–1.2)
CO2: 17 mmol/L — ABNORMAL LOW (ref 20–29)
Calcium: 9.9 mg/dL (ref 8.7–10.2)
Chloride: 102 mmol/L (ref 96–106)
Creatinine, Ser: 0.59 mg/dL (ref 0.57–1.00)
Globulin, Total: 2.8 g/dL (ref 1.5–4.5)
Glucose: 105 mg/dL — ABNORMAL HIGH (ref 70–99)
Potassium: 4.6 mmol/L (ref 3.5–5.2)
Sodium: 136 mmol/L (ref 134–144)
Total Protein: 6.8 g/dL (ref 6.0–8.5)
eGFR: 118 mL/min/{1.73_m2} (ref >59)

## 2022-06-20 LAB — PROTEIN / CREATININE RATIO, URINE
Creatinine, Urine: 151.5 mg/dL
Protein, Ur: 12.4 mg/dL
Protein/Creat Ratio: 82 mg/g creat (ref 0–200)

## 2022-06-20 LAB — HCV INTERPRETATION

## 2022-06-20 NOTE — Telephone Encounter (Signed)
Call placed to pt. Spoke with pt. Pt given results per Dr Crissie Reese. Pt verbalized understanding about results. Scheduled for early 2 hr on 5/7 at 915am. Pt agreeable to date and time of appt.  Judeth Cornfield, RNC

## 2022-06-20 NOTE — Addendum Note (Signed)
Addended by: Merian Capron on: 06/20/2022 02:59 PM   Modules accepted: Orders

## 2022-06-20 NOTE — Telephone Encounter (Signed)
-----   Message from Venora Maples, MD sent at 06/20/2022  2:59 PM EDT ----- A1c in prediabetic range, will need to do a 2 hr GTT, please schedule Remainder of new OB labs unremarkable

## 2022-06-21 LAB — URINE CULTURE, OB REFLEX: Organism ID, Bacteria: NO GROWTH

## 2022-06-21 LAB — CULTURE, OB URINE

## 2022-06-21 LAB — GC/CHLAMYDIA PROBE AMP (~~LOC~~) NOT AT ARMC
Chlamydia: NEGATIVE
Comment: NEGATIVE
Comment: NORMAL
Neisseria Gonorrhea: NEGATIVE

## 2022-06-26 ENCOUNTER — Other Ambulatory Visit: Payer: Self-pay

## 2022-06-26 ENCOUNTER — Other Ambulatory Visit: Payer: 59

## 2022-06-26 DIAGNOSIS — R7303 Prediabetes: Secondary | ICD-10-CM

## 2022-06-27 ENCOUNTER — Encounter: Payer: Self-pay | Admitting: Family Medicine

## 2022-06-27 DIAGNOSIS — O24415 Gestational diabetes mellitus in pregnancy, controlled by oral hypoglycemic drugs: Secondary | ICD-10-CM

## 2022-06-27 DIAGNOSIS — O0990 Supervision of high risk pregnancy, unspecified, unspecified trimester: Secondary | ICD-10-CM

## 2022-06-27 DIAGNOSIS — O24419 Gestational diabetes mellitus in pregnancy, unspecified control: Secondary | ICD-10-CM | POA: Insufficient documentation

## 2022-06-27 DIAGNOSIS — O10919 Unspecified pre-existing hypertension complicating pregnancy, unspecified trimester: Secondary | ICD-10-CM

## 2022-06-27 DIAGNOSIS — O09511 Supervision of elderly primigravida, first trimester: Secondary | ICD-10-CM

## 2022-06-27 LAB — HORIZON CUSTOM: REPORT SUMMARY: NEGATIVE

## 2022-06-27 LAB — PANORAMA PRENATAL TEST FULL PANEL:PANORAMA TEST PLUS 5 ADDITIONAL MICRODELETIONS: FETAL FRACTION: 6.5

## 2022-06-27 LAB — GLUCOSE TOLERANCE, 2 HOURS W/ 1HR
Glucose, 1 hour: 213 mg/dL — ABNORMAL HIGH (ref 70–179)
Glucose, 2 hour: 168 mg/dL — ABNORMAL HIGH (ref 70–152)
Glucose, Fasting: 84 mg/dL (ref 70–91)

## 2022-06-28 ENCOUNTER — Other Ambulatory Visit: Payer: Self-pay | Admitting: Lactation Services

## 2022-06-28 MED ORDER — DEXCOM G6 SENSOR MISC: 1.0000 | 6 refills | Status: AC

## 2022-06-28 MED ORDER — DEXCOM G6 TRANSMITTER MISC: 1.0000 | 3 refills | Status: DC

## 2022-07-09 ENCOUNTER — Telehealth: Payer: 59 | Admitting: Nurse Practitioner

## 2022-07-09 ENCOUNTER — Telehealth: Payer: Self-pay

## 2022-07-09 DIAGNOSIS — N898 Other specified noninflammatory disorders of vagina: Secondary | ICD-10-CM

## 2022-07-09 MED ORDER — FLUCONAZOLE 150 MG PO TABS
150.0000 mg | ORAL_TABLET | Freq: Once | ORAL | 0 refills | Status: AC
Start: 2022-07-09 — End: 2022-07-09

## 2022-07-09 NOTE — Telephone Encounter (Signed)
Patient called nurse phone line regarding concerns about possible yeast infection. Patient expressed that she had difficulty using MyChart message not allowing her to send a message to [place] from a previous conversation in order to ask questions regarding this current issue.   MyChart message sent to patient to see if this resolves issues with MyChart Messaging system--if not, will call patient in regards to yeast infection concern.   Maureen Ralphs RN on 07/09/22 at [phone]

## 2022-07-09 NOTE — Progress Notes (Signed)
For the safety of you and your child, I recommend a face to face office visit with a health care provider.  Many mothers need to take medicines during their pregnancy and while nursing.  Almost all medicines pass into the breast milk in small quantities.  Most are generally considered safe for a mother to take but some medicines must be avoided.  After reviewing your E-Visit request, I recommend that you consult your OB/GYN or pediatrician for medical advice in relation to your condition and prescription medications while pregnant or breastfeeding.  NOTE:  There will be NO CHARGE for this eVisit  If you are having a true medical emergency please call 911.    For an urgent face to face visit, [facility] has six urgent care centers for your convenience:     [facility] [place] Get Driving Directions [phone] [address] [place], [place] [address]    [facility] ([place]) Get Driving Directions [phone] [address] [place], [place] [address]  [facility] ([place]) Get Driving Directions [phone] [address] [place],  [place]  [address]  [facility] Get Driving Directions [phone] [address] [place], [place] [address]   [facility] [place] Get Driving Directions  [phone] [address] [place], [place] [address]   [facility] [place] Get Driving Directions [phone] [address] [place], [place] [address]  Your MyChart E-visit questionnaire answers were reviewed by a board certified advanced clinical practitioner to complete your personal care plan based on your specific symptoms.  Thank you for using e-Visits.    

## 2022-07-19 ENCOUNTER — Other Ambulatory Visit: Payer: 59

## 2022-07-20 ENCOUNTER — Other Ambulatory Visit: Payer: Self-pay

## 2022-07-20 ENCOUNTER — Ambulatory Visit (INDEPENDENT_AMBULATORY_CARE_PROVIDER_SITE_OTHER): Payer: 59 | Admitting: Family Medicine

## 2022-07-20 VITALS — BP 125/82 | HR 102 | Wt 187.4 lb

## 2022-07-20 DIAGNOSIS — O0990 Supervision of high risk pregnancy, unspecified, unspecified trimester: Secondary | ICD-10-CM

## 2022-07-20 DIAGNOSIS — O09511 Supervision of elderly primigravida, first trimester: Secondary | ICD-10-CM

## 2022-07-20 DIAGNOSIS — Z3A16 16 weeks gestation of pregnancy: Secondary | ICD-10-CM

## 2022-07-20 DIAGNOSIS — O208 Other hemorrhage in early pregnancy: Secondary | ICD-10-CM

## 2022-07-20 DIAGNOSIS — O10919 Unspecified pre-existing hypertension complicating pregnancy, unspecified trimester: Secondary | ICD-10-CM

## 2022-07-20 DIAGNOSIS — O24410 Gestational diabetes mellitus in pregnancy, diet controlled: Secondary | ICD-10-CM

## 2022-07-20 NOTE — Progress Notes (Signed)
   PRENATAL VISIT NOTE  Subjective:  Ana Taylor is a 37 y.o. G2P0010 at [redacted]w[redacted]d being seen today for ongoing prenatal care.  She is currently monitored for the following issues for this high-risk pregnancy and has PCOS (polycystic ovarian syndrome); History of abnormal cervical Pap smear; AMA (advanced maternal age) primigravida 35+, first trimester; Subchorionic hemorrhage in first trimester; Supervision of high risk pregnancy, antepartum; Chronic hypertension affecting pregnancy; Prediabetes; and GDM (gestational diabetes mellitus) on their problem list.  Patient reports  pinkish  discharge .  Contractions: Not present. Vag. Bleeding: Scant (spotting started on Wednesday, pinkish; has had some intercourse).  Movement: Present. Denies leaking of fluid.   The following portions of the patient's history were reviewed and updated as appropriate: allergies, current medications, past family history, past medical history, past social history, past surgical history and problem list.   Objective:   Vitals:   07/20/22 1111  BP: 125/82  Pulse: (!) 102  Weight: 187 lb 6.4 oz (85 kg)    Fetal Status: Fetal Heart Rate (bpm): 144   Movement: Present     General:  Alert, oriented and cooperative. Patient is in no acute distress.  Skin: Skin is warm and dry. No rash noted.   Cardiovascular: Normal heart rate noted  Respiratory: Normal respiratory effort, no problems with respiration noted  Abdomen: Soft, gravid, appropriate for gestational age.  Pain/Pressure: Present     Pelvic: Cervical exam performed in the presence of a chaperone        Extremities: Normal range of motion.  Edema: None  Mental Status: Normal mood and affect. Normal behavior. Normal judgment and thought content.   Assessment and Plan:  Pregnancy: G2P0010 at [redacted]w[redacted]d 1. Chronic hypertension affecting pregnancy On nifedipine and ASA BP WNL today Will get regular growth [place] at the appropriate time  2. Diet controlled  gestational diabetes mellitus (GDM) in third trimester Has dexcom but reports her phone is not compatible-- she and a friend have tried.  DM education visit in July but we were able to schedule for 6/4 Hopeful they can help troubleshoot the CGM  3. AMA (advanced maternal age) primigravida 35+, first trimester NIP LR  4. Supervision of high risk pregnancy, antepartum Up to date Feeling movement Pink d/c seems normal, no bright red blood and no cramping Having bilateral hip pain-- located on the ala of hips. Discussed normal stretching of pregnancy.  Has Anatomy US 6/18  5. Subchorionic hemorrhage in first trimester Resolved  Preterm labor symptoms and general obstetric precautions including but not limited to vaginal bleeding, contractions, leaking of fluid and fetal movement were reviewed in detail with the patient. Please refer to After Visit Summary for other counseling recommendations.   Return in about 4 weeks (around 08/17/2022) for Mom+Baby Combined Care.  Future Appointments  Date Time Provider Department Center  07/24/2022 10:15 AM [facility]  08/07/2022  8:45 AM [facility] NURSE [facility]  08/07/2022  9:00 AM [facility] US1 [facility]  08/16/2022  9:15 AM Venora Maples, MD [facility]  08/21/2022 10:15 AM [facility]    Federico Flake, MD

## 2022-07-22 ENCOUNTER — Inpatient Hospital Stay (HOSPITAL_COMMUNITY): Payer: 59

## 2022-07-22 ENCOUNTER — Inpatient Hospital Stay (HOSPITAL_COMMUNITY): Payer: 59 | Admitting: Registered Nurse

## 2022-07-22 ENCOUNTER — Inpatient Hospital Stay (EMERGENCY_DEPARTMENT_HOSPITAL): Payer: 59 | Admitting: Registered Nurse

## 2022-07-22 ENCOUNTER — Inpatient Hospital Stay (HOSPITAL_COMMUNITY)
Admission: AD | Admit: 2022-07-22 | Discharge: 2022-07-23 | Disposition: A | Discharge status: Home / Self Care | Payer: 59 | Attending: Family Medicine | Admitting: Family Medicine

## 2022-07-22 ENCOUNTER — Encounter (HOSPITAL_COMMUNITY): Payer: Self-pay | Admitting: Family Medicine

## 2022-07-22 ENCOUNTER — Encounter (HOSPITAL_COMMUNITY)
Admission: AD | Disposition: A | Discharge status: Home / Self Care | Payer: Self-pay | Source: Home / Self Care | Attending: Family Medicine

## 2022-07-22 DIAGNOSIS — N883 Incompetence of cervix uteri: Secondary | ICD-10-CM | POA: Diagnosis not present

## 2022-07-22 DIAGNOSIS — Z87891 Personal history of nicotine dependence: Secondary | ICD-10-CM | POA: Insufficient documentation

## 2022-07-22 DIAGNOSIS — Z3A16 16 weeks gestation of pregnancy: Secondary | ICD-10-CM | POA: Diagnosis not present

## 2022-07-22 DIAGNOSIS — O165 Unspecified maternal hypertension, complicating the puerperium: Secondary | ICD-10-CM

## 2022-07-22 DIAGNOSIS — E282 Polycystic ovarian syndrome: Secondary | ICD-10-CM

## 2022-07-22 DIAGNOSIS — O3483 Maternal care for other abnormalities of pelvic organs, third trimester: Secondary | ICD-10-CM

## 2022-07-22 DIAGNOSIS — O034 Incomplete spontaneous abortion without complication: Secondary | ICD-10-CM | POA: Diagnosis present

## 2022-07-22 HISTORY — PX: DILATION AND CURETTAGE OF UTERUS: SHX78

## 2022-07-22 LAB — AFP, SERUM, OPEN SPINA BIFIDA
AFP MoM: 1.53
AFP Value: 49.4 ng/mL
Gest. Age on Collection Date: 16 weeks
Maternal Age At EDD: 38.5 yr
OSBR Risk 1 IN: 5046
Test Results:: NEGATIVE
Weight: 187 [lb_av]

## 2022-07-22 LAB — DIC (DISSEMINATED INTRAVASCULAR COAGULATION)PANEL
D-Dimer, Quant: 0.59 ug/mL-FEU — ABNORMAL HIGH (ref 0.00–0.50)
Fibrinogen: 555 mg/dL — ABNORMAL HIGH (ref 210–475)
INR: 1 (ref 0.8–1.2)
Platelets: 301 10*3/uL (ref 150–400)
Prothrombin Time: 13.2 seconds (ref 11.4–15.2)
Smear Review: NONE SEEN
aPTT: 24 seconds (ref 24–36)

## 2022-07-22 LAB — TYPE AND SCREEN
ABO/RH(D): B POS
Antibody Screen: NEGATIVE

## 2022-07-22 LAB — CBC WITH DIFFERENTIAL/PLATELET
Abs Immature Granulocytes: 0.23 K/uL — ABNORMAL HIGH (ref 0.00–0.07)
Basophils Absolute: 0.1 K/uL (ref 0.0–0.1)
Basophils Relative: 1 %
Eosinophils Absolute: 0.1 K/uL (ref 0.0–0.5)
Eosinophils Relative: 1 %
HCT: 39.1 % (ref 36.0–46.0)
Hemoglobin: 12.6 g/dL (ref 12.0–15.0)
Immature Granulocytes: 2 %
Lymphocytes Relative: 20 %
Lymphs Abs: 2.7 K/uL (ref 0.7–4.0)
MCH: 30.4 pg (ref 26.0–34.0)
MCHC: 32.2 g/dL (ref 30.0–36.0)
MCV: 94.4 fL (ref 80.0–100.0)
Monocytes Absolute: 0.9 K/uL (ref 0.1–1.0)
Monocytes Relative: 7 %
Neutro Abs: 9.3 K/uL — ABNORMAL HIGH (ref 1.7–7.7)
Neutrophils Relative %: 69 %
Platelets: 298 K/uL (ref 150–400)
RBC: 4.14 MIL/uL (ref 3.87–5.11)
RDW: 14.4 % (ref 11.5–15.5)
WBC: 13.3 K/uL — ABNORMAL HIGH (ref 4.0–10.5)
nRBC: 0 % (ref 0.0–0.2)

## 2022-07-22 SURGERY — DILATION AND CURETTAGE
Anesthesia: General

## 2022-07-22 MED ORDER — LIDOCAINE-EPINEPHRINE 1 %-1:100000 IJ SOLN
INTRAMUSCULAR | Status: AC
  Filled 2022-07-22: qty 1

## 2022-07-22 MED ORDER — METHYLERGONOVINE MALEATE 0.2 MG/ML IJ SOLN
INTRAMUSCULAR | Status: AC
  Filled 2022-07-22: qty 1

## 2022-07-22 MED ORDER — METHYLERGONOVINE MALEATE 0.2 MG/ML IJ SOLN
INTRAMUSCULAR | Status: DC | PRN
  Administered 2022-07-22: .2 mg via INTRAMUSCULAR

## 2022-07-22 MED ORDER — MIDAZOLAM HCL 2 MG/2ML IJ SOLN: 0.5000 mg | Freq: Once | INTRAMUSCULAR | Status: DC | PRN

## 2022-07-22 MED ORDER — PROPOFOL 10 MG/ML IV BOLUS
INTRAVENOUS | Status: AC
  Filled 2022-07-22: qty 20

## 2022-07-22 MED ORDER — OXYTOCIN 10 UNIT/ML IJ SOLN
INTRAMUSCULAR | Status: DC | PRN
  Administered 2022-07-22: 20 [IU] via INTRAMUSCULAR

## 2022-07-22 MED ORDER — OXYCODONE HCL 5 MG/5ML PO SOLN: 5.0000 mg | Freq: Once | ORAL | Status: DC | PRN

## 2022-07-22 MED ORDER — OXYTOCIN 10 UNIT/ML IJ SOLN
INTRAMUSCULAR | Status: AC
  Filled 2022-07-22: qty 1

## 2022-07-22 MED ORDER — FENTANYL CITRATE (PF) 250 MCG/5ML IJ SOLN
INTRAMUSCULAR | Status: DC | PRN
  Administered 2022-07-22: 50 ug via INTRAVENOUS

## 2022-07-22 MED ORDER — TRANEXAMIC ACID-NACL 1000-0.7 MG/100ML-% IV SOLN
INTRAVENOUS | Status: DC | PRN
  Administered 2022-07-22: 1000 mg via INTRAVENOUS

## 2022-07-22 MED ORDER — KETOROLAC TROMETHAMINE 30 MG/ML IJ SOLN
30.0000 mg | Freq: Once | INTRAMUSCULAR | Status: AC
  Administered 2022-07-22: 30 mg via INTRAVENOUS
  Filled 2022-07-22: qty 1

## 2022-07-22 MED ORDER — MIDAZOLAM HCL 2 MG/2ML IJ SOLN
INTRAMUSCULAR | Status: DC | PRN
  Administered 2022-07-22: 2 mg via INTRAVENOUS

## 2022-07-22 MED ORDER — DEXAMETHASONE SODIUM PHOSPHATE 10 MG/ML IJ SOLN
INTRAMUSCULAR | Status: DC | PRN
  Administered 2022-07-22: 5 mg via INTRAVENOUS

## 2022-07-22 MED ORDER — HYDROMORPHONE HCL 1 MG/ML IJ SOLN
2.0000 mg | INTRAMUSCULAR | Status: DC | PRN
  Administered 2022-07-22: 2 mg via INTRAVENOUS
  Filled 2022-07-22: qty 2

## 2022-07-22 MED ORDER — PROMETHAZINE HCL 25 MG/ML IJ SOLN: 6.2500 mg | INTRAMUSCULAR | Status: DC | PRN

## 2022-07-22 MED ORDER — LIDOCAINE-EPINEPHRINE 1 %-1:100000 IJ SOLN
INTRAMUSCULAR | Status: DC | PRN
  Administered 2022-07-22: 20 mL

## 2022-07-22 MED ORDER — ACETAMINOPHEN 10 MG/ML IV SOLN
INTRAVENOUS | Status: DC | PRN
  Administered 2022-07-22: 1000 mg via INTRAVENOUS

## 2022-07-22 MED ORDER — TRANEXAMIC ACID-NACL 1000-0.7 MG/100ML-% IV SOLN
INTRAVENOUS | Status: AC
  Filled 2022-07-22: qty 100

## 2022-07-22 MED ORDER — MIDAZOLAM HCL 2 MG/2ML IJ SOLN
INTRAMUSCULAR | Status: AC
  Filled 2022-07-22: qty 2

## 2022-07-22 MED ORDER — LACTATED RINGERS IV SOLN: INTRAVENOUS | Status: DC | PRN

## 2022-07-22 MED ORDER — LIDOCAINE 2% (20 MG/ML) 5 ML SYRINGE
INTRAMUSCULAR | Status: AC
  Filled 2022-07-22: qty 5

## 2022-07-22 MED ORDER — DOXYCYCLINE HYCLATE 100 MG IV SOLR
200.0000 mg | INTRAVENOUS | Status: AC
  Administered 2022-07-22: 200 mg via INTRAVENOUS
  Filled 2022-07-22: qty 200

## 2022-07-22 MED ORDER — SODIUM CHLORIDE 0.9 % IV SOLN: INTRAVENOUS | Status: DC | PRN

## 2022-07-22 MED ORDER — IBUPROFEN 600 MG PO TABS: 600.0000 mg | ORAL_TABLET | Freq: Four times a day (QID) | ORAL | 1 refills | Status: AC | PRN

## 2022-07-22 MED ORDER — DIPHENHYDRAMINE HCL 50 MG/ML IJ SOLN
INTRAMUSCULAR | Status: AC
  Filled 2022-07-22: qty 1

## 2022-07-22 MED ORDER — ONDANSETRON HCL 4 MG/2ML IJ SOLN
INTRAMUSCULAR | Status: AC
  Filled 2022-07-22: qty 2

## 2022-07-22 MED ORDER — ONDANSETRON HCL 4 MG/2ML IJ SOLN
INTRAMUSCULAR | Status: DC | PRN
  Administered 2022-07-22: 4 mg via INTRAVENOUS

## 2022-07-22 MED ORDER — PROPOFOL 10 MG/ML IV BOLUS
INTRAVENOUS | Status: DC | PRN
  Administered 2022-07-22: 170 mg via INTRAVENOUS

## 2022-07-22 MED ORDER — ACETAMINOPHEN 10 MG/ML IV SOLN
INTRAVENOUS | Status: AC
  Filled 2022-07-22: qty 100

## 2022-07-22 MED ORDER — SUCCINYLCHOLINE CHLORIDE 200 MG/10ML IV SOSY
PREFILLED_SYRINGE | INTRAVENOUS | Status: DC | PRN
  Administered 2022-07-22: 140 mg via INTRAVENOUS

## 2022-07-22 MED ORDER — MISOPROSTOL 200 MCG PO TABS
800.0000 ug | ORAL_TABLET | Freq: Once | ORAL | Status: AC
  Administered 2022-07-22: 800 ug via BUCCAL
  Filled 2022-07-22: qty 4

## 2022-07-22 MED ORDER — OXYCODONE HCL 5 MG PO TABS: 5.0000 mg | ORAL_TABLET | Freq: Once | ORAL | Status: DC | PRN

## 2022-07-22 MED ORDER — FENTANYL CITRATE (PF) 100 MCG/2ML IJ SOLN: 25.0000 ug | INTRAMUSCULAR | Status: DC | PRN

## 2022-07-22 MED ORDER — DEXAMETHASONE SODIUM PHOSPHATE 10 MG/ML IJ SOLN
INTRAMUSCULAR | Status: AC
  Filled 2022-07-22: qty 1

## 2022-07-22 MED ORDER — FENTANYL CITRATE (PF) 250 MCG/5ML IJ SOLN
INTRAMUSCULAR | Status: AC
  Filled 2022-07-22: qty 5

## 2022-07-22 MED ORDER — SODIUM CHLORIDE 0.9 % IV BOLUS
1000.0000 mL | Freq: Once | INTRAVENOUS | Status: AC
  Administered 2022-07-22: 1000 mL via INTRAVENOUS

## 2022-07-22 MED ORDER — DIPHENHYDRAMINE HCL 50 MG/ML IJ SOLN
INTRAMUSCULAR | Status: DC | PRN
  Administered 2022-07-22: 12.5 mg via INTRAVENOUS

## 2022-07-22 MED ORDER — LIDOCAINE 2% (20 MG/ML) 5 ML SYRINGE
INTRAMUSCULAR | Status: DC | PRN
  Administered 2022-07-22: 20 mg via INTRAVENOUS

## 2022-07-22 MED ORDER — MEPERIDINE HCL 25 MG/ML IJ SOLN: 6.2500 mg | INTRAMUSCULAR | Status: DC | PRN

## 2022-07-22 SURGICAL SUPPLY — 15 items
CATH ROBINSON RED A/P 16FR (CATHETERS) ×1 IMPLANT
CNTNR URN SCR LID CUP LEK RST (MISCELLANEOUS) ×1 IMPLANT
CONT SPEC 4OZ STRL OR WHT (MISCELLANEOUS) ×1
DRSG TELFA 3X8 NADH STRL (GAUZE/BANDAGES/DRESSINGS) ×1 IMPLANT
GAUZE 4X4 16PLY ~~LOC~~+RFID DBL (SPONGE) IMPLANT
GLOVE BIOGEL PI IND STRL 7.0 (GLOVE) ×2 IMPLANT
GLOVE ECLIPSE 7.0 STRL STRAW (GLOVE) ×1 IMPLANT
GOWN STRL REUS W/ TWL LRG LVL3 (GOWN DISPOSABLE) ×2 IMPLANT
GOWN STRL REUS W/TWL LRG LVL3 (GOWN DISPOSABLE) ×2
PACK VAGINAL MINOR WOMEN LF (CUSTOM PROCEDURE TRAY) ×1 IMPLANT
PAD OB MATERNITY 4.3X12.25 (PERSONAL CARE ITEMS) ×1 IMPLANT
SPIKE FLUID TRANSFER (MISCELLANEOUS) ×1 IMPLANT
TOWEL GREEN STERILE FF (TOWEL DISPOSABLE) ×2 IMPLANT
UNDERPAD 30X36 HEAVY ABSORB (UNDERPADS AND DIAPERS) ×1 IMPLANT
VACURETTE 10 RIGID CVD (CANNULA) IMPLANT

## 2022-07-22 NOTE — H&P (Signed)
Ana Taylor is an 37 y.o. G2P0010 female.   Chief Complaint: vaginal bleeding HPI: Patient had a 16 week loss with loss of fetus attached to placenta and umbilical cord at home tonight prior to arrival.  She denied pain or bleeding prior to this. Brought in by EMS with some blood loss. She had an u/s revealing 4 cm endometrial thickness with blood flow c/w retained POC. She has on-going cramping and bleeding.  Past Medical History:  Diagnosis Date   Hypercholesterolemia    PCOS (polycystic ovarian syndrome)     Past Surgical History:  Procedure Laterality Date   LEEP      Family History  Problem Relation Age of Onset   Arthritis Mother    Diabetes Mother    Heart failure Mother    Diabetes Father    Heart disease Father    Heart attack Father    Hypertension Father    Clotting disorder Father    Social History:  reports that she has quit smoking. She has never used smokeless tobacco. She reports that she does not currently use alcohol. She reports that she does not use drugs.  Allergies: No Known Allergies  Medications Prior to Admission  Medication Sig Dispense Refill   NIFEdipine (PROCARDIA-XL/NIFEDICAL-XL) 30 MG 24 hr tablet Take 1 tablet (30 mg total) by mouth daily. 30 tablet 5   Prenatal Vit-Fe Fumarate-FA (PRENATAL MULTIVITAMIN) TABS tablet Take 1 tablet by mouth daily at 12 noon.     aspirin EC 81 MG tablet Take 2 tablets (162 mg total) by mouth daily. Swallow whole. (Patient not taking: Reported on 07/20/2022) 180 tablet 0   Continuous Glucose Transmitter (DEXCOM G6 TRANSMITTER) MISC 1 Device by Does not apply route every 3 (three) months. (Patient not taking: Reported on 07/20/2022) 1 each 3   loratadine (CLARITIN REDITABS) 10 MG dissolvable tablet Take 10 mg by mouth daily. (Patient not taking: Reported on 07/20/2022)      ROS: Abdominal distention  Blood pressure (!) 128/90, pulse 90, temperature 98.2 F (36.8 C), temperature source Oral, resp. rate  18, last menstrual period 03/26/2022, SpO2 99 %. General appearance: alert, cooperative, and appears stated age Head: Normocephalic, without obvious abnormality, atraumatic Neck: supple, symmetrical, trachea midline Lungs:  normal effort Heart: regular rate and rhythm Abdomen:  soft, tympanic, distended, non-tender Skin: Skin color, texture, turgor normal. No rashes or lesions Neurologic: Grossly normal   Results for orders placed or performed during the hospital encounter of 07/22/22 (from the past 24 hour(s))  Type and screen     Status: None   Collection Time: 07/22/22  8:11 PM  Result Value Ref Range   ABO/RH(D) B POS    Antibody Screen NEG    Sample Expiration      07/25/2022,2359 Performed at [facility], [address], [place], [place] [address]   DIC Panel ONCE - STAT     Status: Abnormal (Preliminary result)   Collection Time: 07/22/22  8:11 PM  Result Value Ref Range   Prothrombin Time 13.2 11.4 - 15.2 seconds   INR 1.0 0.8 - 1.2   aPTT 24 24 - 36 seconds   Fibrinogen 555 (H) 210 - 475 mg/dL   D-Dimer, Quant 6.04 (H) 0.00 - 0.50 ug/mL-FEU   Platelets 301 150 - 400 K/uL   Smear Review PENDING   CBC with Differential/Platelet     Status: Abnormal   Collection Time: 07/22/22  8:14 PM  Result Value Ref Range   WBC  13.3 (H) 4.0 - 10.5 K/uL   RBC 4.14 3.87 - 5.11 MIL/uL   Hemoglobin 12.6 12.0 - 15.0 g/dL   HCT 16.1 09.6 - 04.5 %   MCV 94.4 80.0 - 100.0 fL   MCH 30.4 26.0 - 34.0 pg   MCHC 32.2 30.0 - 36.0 g/dL   RDW 40.9 81.1 - 91.4 %   Platelets 298 150 - 400 K/uL   nRBC 0.0 0.0 - 0.2 %   Neutrophils Relative % 69 %   Neutro Abs 9.3 (H) 1.7 - 7.7 K/uL   Lymphocytes Relative 20 %   Lymphs Abs 2.7 0.7 - 4.0 K/uL   Monocytes Relative 7 %   Monocytes Absolute 0.9 0.1 - 1.0 K/uL   Eosinophils Relative 1 %   Eosinophils Absolute 0.1 0.0 - 0.5 K/uL   Basophils Relative 1 %   Basophils Absolute 0.1 0.0 - 0.1 K/uL   Immature Granulocytes 2 %   Abs Immature  Granulocytes 0.23 (H) 0.00 - 0.07 K/uL   US PELVIS LIMITED (TRANSABDOMINAL ONLY)  Result Date: 07/22/2022 CLINICAL DATA:  Spontaneous abortion in progress, heavy vaginal bleeding EXAM: LIMITED ULTRASOUND OF PELVIS TECHNIQUE: Limited transabdominal ultrasound examination of the pelvis was performed. COMPARISON:  06/07/2022 FINDINGS: The uterus is anteverted measuring 15.9 x 9.7 x 10.0 cm. Heterogeneous thickened endometrium is identified, measuring up to 4.1 cm, with increased vascularity. Findings are consistent with retained products of conception. No evidence of intrauterine pregnancy. No adnexal masses. The ovaries are not well visualized. No free fluid. IMPRESSION: 1. Heterogeneous, hypervascular and thickened endometrium, consistent with retained products of conception in a patient undergoing spontaneous abortion. No evidence of intrauterine pregnancy. Electronically Signed   By: Sharlet Salina M.D.   On: 07/22/2022 20:56    Assessment/Plan Retained products of conception after miscarriage  Incompetent cervix  For D & E due to retained products and on-going bleeding. Risks include but are not limited to bleeding, infection, injury to surrounding structures, including bowel, bladder and ureters, blood clots, and death.  Likelihood of success is high.    Reva Bores 07/22/2022, 9:43 PM

## 2022-07-22 NOTE — Anesthesia Procedure Notes (Signed)
Procedure Name: Intubation Date/Time: 07/22/2022 10:47 PM  Performed by: Laruth Bouchard., CRNAPre-anesthesia Checklist: Patient identified, Emergency Drugs available, Suction available, Patient being monitored and Timeout performed Patient Re-evaluated:Patient Re-evaluated prior to induction Oxygen Delivery Method: Circle system utilized Preoxygenation: Pre-oxygenation with 100% oxygen Induction Type: IV induction, Rapid sequence and Cricoid Pressure applied Laryngoscope Size: Mac and 3 Grade View: Grade I Tube type: Oral Tube size: 7.0 mm Number of attempts: 1 Airway Equipment and Method: Stylet Placement Confirmation: ETT inserted through vocal cords under direct vision, positive ETCO2 and breath sounds checked- equal and bilateral Secured at: 22 cm Tube secured with: Tape Dental Injury: Teeth and Oropharynx as per pre-operative assessment

## 2022-07-22 NOTE — MAU Provider Note (Signed)
History     CSN: [id number]  Arrival date and time: 07/22/22 2005  None    Chief Complaint  Patient presents with   Miscarriage   Vaginal Bleeding   Shanesha K Hampton-Poole is a 37 y.o. G2P0010 at [redacted]w[redacted]d who presents for miscarriage that occurred around [place]. She went to use the restroom and following that she states there was a sound of something falling into the toilet which was the placenta and fetus. EMS was called. The cord avulsed when the fetus was retrieved. EMS stated there was a large amount of blood and clots in the toilet bowl. She has a hx of prior LEEP and prior miscarriage at 12wks. She is having abdominal pain. Her last meal was around 6PM.   OB History     Gravida  2   Para      Term      Preterm      AB  1   Living  0      SAB  1   IAB      Ectopic      Multiple      Live Births              Past Medical History:  Diagnosis Date   Hypercholesterolemia    PCOS (polycystic ovarian syndrome)     Past Surgical History:  Procedure Laterality Date   LEEP      Family History  Problem Relation Age of Onset   Arthritis Mother    Diabetes Mother    Heart failure Mother    Diabetes Father    Heart disease Father    Heart attack Father    Hypertension Father    Clotting disorder Father     Social History   Tobacco Use   Smoking status: Former   Smokeless tobacco: Never  Building services engineer Use: Never used  Substance Use Topics   Alcohol use: Not Currently    Comment: occ   Drug use: Never    Allergies: No Known Allergies  Medications Prior to Admission  Medication Sig Dispense Refill Last Dose   aspirin EC 81 MG tablet Take 2 tablets (162 mg total) by mouth daily. Swallow whole. (Patient not taking: Reported on 07/20/2022) 180 tablet 0    Continuous Glucose Transmitter (DEXCOM G6 TRANSMITTER) MISC 1 Device by Does not apply route every 3 (three) months. (Patient not taking: Reported on 07/20/2022) 1 each 3    loratadine  (CLARITIN REDITABS) 10 MG dissolvable tablet Take 10 mg by mouth daily. (Patient not taking: Reported on 07/20/2022)      NIFEdipine (PROCARDIA-XL/NIFEDICAL-XL) 30 MG 24 hr tablet Take 1 tablet (30 mg total) by mouth daily. 30 tablet 5    Prenatal Vit-Fe Fumarate-FA (PRENATAL MULTIVITAMIN) TABS tablet Take 1 tablet by mouth daily at 12 noon.       Review of Systems  Constitutional:  Negative for activity change, appetite change and fever.  Cardiovascular:  Negative for chest pain.  Gastrointestinal:  Positive for abdominal pain.  Genitourinary:  Positive for vaginal bleeding. Negative for difficulty urinating.   Physical Exam   Blood pressure 131/79, pulse 89, temperature 98.2 F (36.8 C), temperature source Oral, resp. rate 18, last menstrual period 03/26/2022, SpO2 97 %.  Physical Exam Vitals reviewed. Exam conducted with a chaperone present.  Constitutional:      General: She is in acute distress.     Appearance: She is not ill-appearing, toxic-appearing or diaphoretic.  HENT:  Right Ear: External ear normal.     Left Ear: External ear normal.     Nose: Nose normal.  Eyes:     Conjunctiva/sclera: Conjunctivae normal.  Cardiovascular:     Rate and Rhythm: Normal rate and regular rhythm.     Heart sounds: Normal heart sounds.  Pulmonary:     Effort: Pulmonary effort is normal.     Breath sounds: Normal breath sounds.  Abdominal:     Palpations: Abdomen is soft.     Tenderness: There is no abdominal tenderness. There is no guarding or rebound.     Comments: gravid  Genitourinary:    Vagina: Bleeding present.  Musculoskeletal:     Right lower leg: No edema.     Left lower leg: No edema.  Neurological:     Mental Status: She is alert and oriented to person, place, and time.  Psychiatric:     Comments: Appropriate mood for the situation     MAU Course  Procedures  MDM Unknown amount of blood loss prior to arrival. Will quantify moving forward.   2000-VSS. S/p 1 L  bolus, buccal cytotec, IV toradol. BSUS results pending. Dr. Shawnie Pons made aware of the patient.  2105- Hgb 12.6. DIC panel wnl. Abdominal discomfort returned. Vaginal bleeding stable with fundal rub. S/p 2mg  of dilaudid.   CLINICAL DATA:  Spontaneous abortion in progress, heavy vaginal bleeding   EXAM: LIMITED ULTRASOUND OF PELVIS   TECHNIQUE: Limited transabdominal ultrasound examination of the pelvis was performed.   COMPARISON:  06/07/2022   FINDINGS: The uterus is anteverted measuring 15.9 x 9.7 x 10.0 cm. Heterogeneous thickened endometrium is identified, measuring up to 4.1 cm, with increased vascularity. Findings are consistent with retained products of conception. No evidence of intrauterine pregnancy. No adnexal masses. The ovaries are not well visualized. No free fluid.   IMPRESSION: 1. Heterogeneous, hypervascular and thickened endometrium, consistent with retained products of conception in a patient undergoing spontaneous abortion. No evidence of intrauterine pregnancy.  Dr. Shawnie Pons assumed care of this patient due to POC and continued vaginal bleeding.   She was prepared for the OR for D&C.   Assessment and Plan  1. Retained products of conception after miscarriage Product of conception that were expel was exam. Placenta did not appear to be intact. Confirmed by ultrasound. She was taken to the OR for D&C.   2. Incompetent cervix 16 week loss with prior 12 week loss and hx of LEEP   Dietrick Barris Autry-Lott 07/22/2022, 8:18 PM

## 2022-07-22 NOTE — MAU Note (Signed)
Photo taken at pt's request with pt's phone of fetus

## 2022-07-22 NOTE — Op Note (Signed)
PROCEDURE DATE: 07/22/2022  PREOPERATIVE DIAGNOSIS: Retained placenta and POC  POSTOPERATIVE DIAGNOSIS: The same  PROCEDURE:  Suction  Dilation and Evacuation.  SURGEON:  Jamey Demchak S  INDICATIONS: 37 y.o. G2P0010 @ [redacted]w[redacted]d with recent SVD of NVFI with retained placenta. Risks of surgery were discussed with the patient including but not limited to: bleeding which may require transfusion; infection which may require antibiotics; injury to uterus or surrounding organs.    FINDINGS:  An open cervical os  ANESTHESIA:  Sherilyn Banker, Rosalene Billings, MD, and paracervical block  ESTIMATED BLOOD LOSS:  650 ml.  SPECIMENS:  Placenta to Pathology  COMPLICATIONS:  None immediate.  PROCEDURE DETAILS:  The patient was then taken to the operating room where .  after an adequate timeout was performed, she was placed in the dorsal lithotomy position and examined; then prepped and draped in the sterile manner.   Her bladder was catheterized for an unmeasured amount of clear, yellow urine. A vaginal speculum was then placed in the patient's vagina and the placenta encountered and removed from the vagina, continued bleeding noted and a ring forcep was applied to the anterior lip of the cervix.  A paracervical block using 0.25% Marcaine with Epi was administered.  A sharp curettage  and Suction with 10 F Catheter used to remove more retained POC and performed until a gritty texture was found in all four quadrants to ensure no placental parts remained.There was bleeding noted and a firm uterus was found on bimanual massage which would then begin bleeding again. Multiple swipes with curette to ensure no retained POC and would firm up and then relax. Patient given IM methergine and 20 u IV pitocin and then finally uterus firmed up and remained so that bleeding was minimum. The patient tolerated the procedure well.  The patient was taken to the recovery area in stable condition.  Reva Bores, MD 07/22/2022, 11:35 PM

## 2022-07-22 NOTE — MAU Note (Addendum)
Ana Taylor is a 37 y.o. at [redacted]w[redacted]d here in MAU reporting: by EMS stating the pt was actively hemorrhaging. CNM alerted. CNM and Dr. Salvadore Dom at bedside. Pt passed fetus at home. [place] at bedside at 0806. Pt states she has been spotting since Tuesday  07/17/2022. Pt states she went to the bathroom and felt something come out. Pt states she starting bleeding heavy after that at around 1855. Pt denies passing any clots.   Onset of complaint: 07/22/2022 1855 Pain score: 8/10 Vitals:   07/22/22 2030 07/22/22 2035  BP: 131/79   Pulse: 89   Resp:    Temp:    SpO2: 97% 97%     FHT:n/a Lab orders placed from triage:

## 2022-07-22 NOTE — OR Nursing (Signed)
Total 650 ml blood loss

## 2022-07-22 NOTE — Transfer of Care (Signed)
Immediate Anesthesia Transfer of Care Note  Patient: Ana Taylor  Procedure(s) Performed: DILATATION AND CURETTAGE  Patient Location: PACU  Anesthesia Type:General  Level of Consciousness: awake, alert , oriented, and drowsy  Airway & Oxygen Therapy: Patient Spontanous Breathing and Patient connected to nasal cannula oxygen  Post-op Assessment: Report given to RN and Post -op Vital signs reviewed and stable  Post vital signs: Reviewed and stable  Last Vitals:  Vitals Value Taken Time  BP 136/66 07/22/22 2348  Temp    Pulse 96 07/22/22 2351  Resp 22 07/22/22 2351  SpO2 100 % 07/22/22 2351  Vitals shown include unvalidated device data.  Last Pain:  Vitals:   07/22/22 2026  TempSrc:   PainSc: 0-No pain         Complications: No notable events documented.

## 2022-07-22 NOTE — Anesthesia Preprocedure Evaluation (Addendum)
Anesthesia Evaluation  Patient identified by MRN, date of birth, ID band Patient awake    Reviewed: Allergy & Precautions, NPO status , Patient's Chart, lab work & pertinent test results  History of Anesthesia Complications Negative for: history of anesthetic complications  Airway Mallampati: II  TM Distance: >3 FB Neck ROM: Full    Dental  (+) Dental Advisory Given   Pulmonary former smoker   breath sounds clear to auscultation       Cardiovascular hypertension, Pt. on medications  Rhythm:Regular Rate:Normal     Neuro/Psych negative neurological ROS     GI/Hepatic negative GI ROS, Neg liver ROS,,,  Endo/Other  PCOS BMI 32  Renal/GU negative Renal ROS     Musculoskeletal   Abdominal   Peds  Hematology Hb 12.6, plt 298k   Anesthesia Other Findings   Reproductive/Obstetrics Retained products of conception: SAB 6:30pm                             Anesthesia Physical Anesthesia Plan  ASA: 2 and emergent  Anesthesia Plan: General   Post-op Pain Management: Ofirmev IV (intra-op)*   Induction: Intravenous and Rapid sequence  PONV Risk Score and Plan: 3 and Ondansetron, Dexamethasone and Scopolamine patch - Pre-op  Airway Management Planned: Oral ETT  Additional Equipment: None  Intra-op Plan:   Post-operative Plan: Extubation in OR  Informed Consent: I have reviewed the patients History and Physical, chart, labs and discussed the procedure including the risks, benefits and alternatives for the proposed anesthesia with the patient or authorized representative who has indicated his/her understanding and acceptance.     Dental advisory given  Plan Discussed with: CRNA and Surgeon  Anesthesia Plan Comments:         Anesthesia Quick Evaluation

## 2022-07-23 ENCOUNTER — Encounter (HOSPITAL_COMMUNITY): Payer: Self-pay | Admitting: Family Medicine

## 2022-07-23 NOTE — Anesthesia Postprocedure Evaluation (Signed)
Anesthesia Post Note  Patient: Ana Taylor  Procedure(s) Performed: DILATATION AND CURETTAGE     Patient location during evaluation: PACU Anesthesia Type: General Level of consciousness: awake and alert, patient cooperative and oriented Pain management: pain level controlled Vital Signs Assessment: post-procedure vital signs reviewed and stable Respiratory status: spontaneous breathing, nonlabored ventilation and respiratory function stable Cardiovascular status: blood pressure returned to baseline and stable Postop Assessment: no apparent nausea or vomiting Anesthetic complications: no   No notable events documented.  Last Vitals:  Vitals:   07/22/22 2350 07/23/22 0010  BP: 136/66   Pulse: 98 83  Resp: 17 13  Temp: 36.5 C   SpO2: 100% 97%    Last Pain:  Vitals:   07/22/22 2022  TempSrc: Oral                 Elliott Quade,E. Vana Arif

## 2022-07-24 ENCOUNTER — Other Ambulatory Visit: Payer: 59

## 2022-07-24 LAB — SURGICAL PATHOLOGY

## 2022-08-06 ENCOUNTER — Encounter: Payer: 59 | Admitting: Family Medicine

## 2022-08-07 ENCOUNTER — Ambulatory Visit: Payer: 59

## 2022-08-09 ENCOUNTER — Encounter: Payer: Self-pay | Admitting: General Practice

## 2022-08-14 ENCOUNTER — Encounter: Payer: 59 | Admitting: Family Medicine

## 2022-08-16 ENCOUNTER — Encounter: Payer: 59 | Admitting: Family Medicine

## 2022-08-21 ENCOUNTER — Other Ambulatory Visit: Payer: 59

## 2022-09-17 ENCOUNTER — Telehealth: Payer: Self-pay

## 2022-09-17 NOTE — Telephone Encounter (Signed)
-----   Message from Venora Maples sent at 09/14/2022  1:33 PM EDT ----- Regarding: follow up after 16 week loss Was just looking at my folder with old colpo results that need following up and saw that she never got scheduled for a follow up visit after her 16 week loss. Please reach out and see if we can get her to check and see how she's doing.   [employer]

## 2022-09-17 NOTE — Telephone Encounter (Signed)
Called pt. Pt did not answer. Left VM for pt to return call to office or send mychart message for scheduling follow up appt.  Judeth Cornfield, RNC

## 2022-09-27 NOTE — Telephone Encounter (Signed)
Attempted to call patient again for update appt. Pt did not answer. Noticed on pt's encounter that she was seen on 08/29/22 with FNP at [facility] for annual and follow up since SAB. Will route this encounter to Dr Crissie Reese for update.  Judeth Cornfield, RNC

## 2022-12-31 ENCOUNTER — Inpatient Hospital Stay (HOSPITAL_COMMUNITY): Admit: 2022-12-31 | Payer: Self-pay
